# Patient Record
Sex: Male | Born: 1970 | Race: Black or African American | Hispanic: No | Marital: Single | Smoking: Former smoker
Health system: Southern US, Community
[De-identification: ages and names within clinical notes are randomized; demographics above are authoritative.]

## PROBLEM LIST (undated history)

## (undated) DIAGNOSIS — Z8719 Personal history of other diseases of the digestive system: Secondary | ICD-10-CM

## (undated) DIAGNOSIS — I2119 ST elevation (STEMI) myocardial infarction involving other coronary artery of inferior wall: Secondary | ICD-10-CM

## (undated) DIAGNOSIS — F1010 Alcohol abuse, uncomplicated: Secondary | ICD-10-CM

## (undated) DIAGNOSIS — Z720 Tobacco use: Secondary | ICD-10-CM

## (undated) DIAGNOSIS — I2510 Atherosclerotic heart disease of native coronary artery without angina pectoris: Secondary | ICD-10-CM

## (undated) DIAGNOSIS — I119 Hypertensive heart disease without heart failure: Secondary | ICD-10-CM

## (undated) DIAGNOSIS — I739 Peripheral vascular disease, unspecified: Secondary | ICD-10-CM

## (undated) DIAGNOSIS — Z9861 Coronary angioplasty status: Principal | ICD-10-CM

## (undated) HISTORY — DX: ST elevation (STEMI) myocardial infarction involving other coronary artery of inferior wall: I21.19

## (undated) HISTORY — DX: Atherosclerotic heart disease of native coronary artery without angina pectoris: I25.10

## (undated) HISTORY — DX: Peripheral vascular disease, unspecified: I73.9

## (undated) HISTORY — DX: Coronary angioplasty status: Z98.61

---

## 2008-03-28 ENCOUNTER — Emergency Department (HOSPITAL_COMMUNITY): Admission: EM | Admit: 2008-03-28 | Discharge: 2008-03-28 | Payer: Self-pay | Admitting: Emergency Medicine

## 2008-03-31 ENCOUNTER — Emergency Department (HOSPITAL_COMMUNITY): Admission: EM | Admit: 2008-03-31 | Discharge: 2008-03-31 | Payer: Self-pay | Admitting: Emergency Medicine

## 2008-06-12 ENCOUNTER — Emergency Department (HOSPITAL_COMMUNITY): Admission: EM | Admit: 2008-06-12 | Discharge: 2008-06-12 | Payer: Self-pay | Admitting: Emergency Medicine

## 2008-06-23 ENCOUNTER — Emergency Department (HOSPITAL_COMMUNITY): Admission: EM | Admit: 2008-06-23 | Discharge: 2008-06-23 | Payer: Self-pay | Admitting: Emergency Medicine

## 2008-12-27 ENCOUNTER — Emergency Department (HOSPITAL_COMMUNITY): Admission: EM | Admit: 2008-12-27 | Discharge: 2008-12-28 | Payer: Self-pay | Admitting: Emergency Medicine

## 2008-12-30 ENCOUNTER — Emergency Department (HOSPITAL_COMMUNITY): Admission: EM | Admit: 2008-12-30 | Discharge: 2008-12-30 | Payer: Self-pay | Admitting: Emergency Medicine

## 2009-02-28 ENCOUNTER — Emergency Department (HOSPITAL_COMMUNITY): Admission: EM | Admit: 2009-02-28 | Discharge: 2009-02-28 | Payer: Self-pay | Admitting: Emergency Medicine

## 2009-06-07 ENCOUNTER — Emergency Department (HOSPITAL_COMMUNITY): Admission: EM | Admit: 2009-06-07 | Discharge: 2009-06-07 | Payer: Self-pay | Admitting: Emergency Medicine

## 2009-10-27 ENCOUNTER — Emergency Department (HOSPITAL_COMMUNITY): Admission: EM | Admit: 2009-10-27 | Discharge: 2009-10-28 | Payer: Self-pay | Admitting: Emergency Medicine

## 2009-11-27 ENCOUNTER — Emergency Department (HOSPITAL_COMMUNITY): Admission: EM | Admit: 2009-11-27 | Discharge: 2009-11-27 | Payer: Self-pay | Admitting: Emergency Medicine

## 2010-09-02 ENCOUNTER — Emergency Department (HOSPITAL_COMMUNITY)
Admission: EM | Admit: 2010-09-02 | Discharge: 2010-09-02 | Payer: Self-pay | Source: Home / Self Care | Admitting: Emergency Medicine

## 2010-12-03 LAB — COMPREHENSIVE METABOLIC PANEL
ALT: 22 U/L (ref 0–53)
AST: 17 U/L (ref 0–37)
Albumin: 4.1 g/dL (ref 3.5–5.2)
Alkaline Phosphatase: 53 U/L (ref 39–117)
BUN: 11 mg/dL (ref 6–23)
CO2: 27 mEq/L (ref 19–32)
Calcium: 9.4 mg/dL (ref 8.4–10.5)
Chloride: 103 mEq/L (ref 96–112)
Creatinine, Ser: 1.19 mg/dL (ref 0.4–1.5)
GFR calc Af Amer: >60 mL/min (ref >60)
GFR calc non Af Amer: >60 mL/min (ref >60)
Glucose, Bld: 92 mg/dL (ref 70–99)
Potassium: 3.9 mEq/L (ref 3.5–5.1)
Sodium: 138 mEq/L (ref 135–145)
Total Bilirubin: 0.6 mg/dL (ref 0.3–1.2)
Total Protein: 5.6 g/dL — ABNORMAL LOW (ref 6.0–8.3)

## 2010-12-03 LAB — CBC
HCT: 42.5 % (ref 39.0–52.0)
Hemoglobin: 14.8 g/dL (ref 13.0–17.0)
MCH: 24.3 pg — ABNORMAL LOW (ref 26.0–34.0)
MCHC: 34.8 g/dL (ref 30.0–36.0)
MCV: 69.7 fL — ABNORMAL LOW (ref 78.0–100.0)
Platelets: 138 10*3/uL — ABNORMAL LOW (ref 150–400)
RBC: 6.1 MIL/uL — ABNORMAL HIGH (ref 4.22–5.81)
RDW: 15.8 % — ABNORMAL HIGH (ref 11.5–15.5)
WBC: 8.1 10*3/uL (ref 4.0–10.5)

## 2010-12-03 LAB — LIPASE, BLOOD: Lipase: 28 U/L (ref 11–59)

## 2010-12-11 LAB — URINE MICROSCOPIC-ADD ON

## 2010-12-11 LAB — URINALYSIS, ROUTINE W REFLEX MICROSCOPIC
Bilirubin Urine: NEGATIVE
Glucose, UA: NEGATIVE mg/dL
Leukocytes, UA: NEGATIVE
Nitrite: NEGATIVE
Specific Gravity, Urine: >1.03 — ABNORMAL HIGH (ref 1.005–1.030)
pH: 6 (ref 5.0–8.0)

## 2010-12-11 LAB — CBC
HCT: 44.9 % (ref 39.0–52.0)
Hemoglobin: 14.7 g/dL (ref 13.0–17.0)
RBC: 5.89 MIL/uL — ABNORMAL HIGH (ref 4.22–5.81)
RDW: 15.1 % (ref 11.5–15.5)
WBC: 8.4 10*3/uL (ref 4.0–10.5)

## 2010-12-11 LAB — DIFFERENTIAL
Basophils Absolute: 0 10*3/uL (ref 0.0–0.1)
Eosinophils Relative: 4 % (ref 0–5)
Lymphocytes Relative: 10 % — ABNORMAL LOW (ref 12–46)
Lymphs Abs: 0.8 10*3/uL (ref 0.7–4.0)
Monocytes Absolute: 1.1 10*3/uL — ABNORMAL HIGH (ref 0.1–1.0)
Neutro Abs: 6.2 10*3/uL (ref 1.7–7.7)

## 2010-12-11 LAB — BASIC METABOLIC PANEL
Calcium: 8.8 mg/dL (ref 8.4–10.5)
GFR calc Af Amer: >60 mL/min (ref >60)
GFR calc non Af Amer: >60 mL/min (ref >60)
Glucose, Bld: 104 mg/dL — ABNORMAL HIGH (ref 70–99)
Potassium: 4.2 mEq/L (ref 3.5–5.1)
Sodium: 136 mEq/L (ref 135–145)

## 2010-12-27 LAB — CULTURE, ROUTINE-ABSCESS

## 2011-03-08 ENCOUNTER — Emergency Department (HOSPITAL_COMMUNITY)
Admission: EM | Admit: 2011-03-08 | Discharge: 2011-03-09 | Disposition: A | Discharge status: Home / Self Care | Payer: No Typology Code available for payment source | Attending: Emergency Medicine | Admitting: Emergency Medicine

## 2011-03-08 ENCOUNTER — Emergency Department (HOSPITAL_COMMUNITY): Payer: No Typology Code available for payment source

## 2011-03-08 DIAGNOSIS — Y9289 Other specified places as the place of occurrence of the external cause: Secondary | ICD-10-CM | POA: Insufficient documentation

## 2011-03-08 DIAGNOSIS — I10 Essential (primary) hypertension: Secondary | ICD-10-CM | POA: Insufficient documentation

## 2011-03-08 DIAGNOSIS — S8000XA Contusion of unspecified knee, initial encounter: Secondary | ICD-10-CM | POA: Insufficient documentation

## 2011-03-08 DIAGNOSIS — J45909 Unspecified asthma, uncomplicated: Secondary | ICD-10-CM | POA: Insufficient documentation

## 2011-06-24 LAB — MONONUCLEOSIS SCREEN: Mono Screen: NEGATIVE

## 2013-04-22 ENCOUNTER — Encounter (HOSPITAL_COMMUNITY): Payer: Self-pay

## 2013-04-22 ENCOUNTER — Emergency Department (HOSPITAL_COMMUNITY)
Admission: EM | Admit: 2013-04-22 | Discharge: 2013-04-23 | Disposition: A | Discharge status: Home / Self Care | Payer: Self-pay | Attending: Emergency Medicine | Admitting: Emergency Medicine

## 2013-04-22 DIAGNOSIS — R1110 Vomiting, unspecified: Secondary | ICD-10-CM | POA: Insufficient documentation

## 2013-04-22 DIAGNOSIS — R3589 Other polyuria: Secondary | ICD-10-CM | POA: Insufficient documentation

## 2013-04-22 DIAGNOSIS — W57XXXA Bitten or stung by nonvenomous insect and other nonvenomous arthropods, initial encounter: Secondary | ICD-10-CM | POA: Insufficient documentation

## 2013-04-22 DIAGNOSIS — R51 Headache: Secondary | ICD-10-CM | POA: Insufficient documentation

## 2013-04-22 DIAGNOSIS — R631 Polydipsia: Secondary | ICD-10-CM | POA: Insufficient documentation

## 2013-04-22 DIAGNOSIS — I499 Cardiac arrhythmia, unspecified: Secondary | ICD-10-CM | POA: Insufficient documentation

## 2013-04-22 DIAGNOSIS — IMO0002 Reserved for concepts with insufficient information to code with codable children: Secondary | ICD-10-CM | POA: Insufficient documentation

## 2013-04-22 DIAGNOSIS — Y929 Unspecified place or not applicable: Secondary | ICD-10-CM | POA: Insufficient documentation

## 2013-04-22 DIAGNOSIS — I10 Essential (primary) hypertension: Secondary | ICD-10-CM | POA: Insufficient documentation

## 2013-04-22 DIAGNOSIS — F17200 Nicotine dependence, unspecified, uncomplicated: Secondary | ICD-10-CM | POA: Insufficient documentation

## 2013-04-22 DIAGNOSIS — Z79899 Other long term (current) drug therapy: Secondary | ICD-10-CM | POA: Insufficient documentation

## 2013-04-22 DIAGNOSIS — Y9389 Activity, other specified: Secondary | ICD-10-CM | POA: Insufficient documentation

## 2013-04-22 DIAGNOSIS — T148 Other injury of unspecified body region: Secondary | ICD-10-CM | POA: Insufficient documentation

## 2013-04-22 DIAGNOSIS — R358 Other polyuria: Secondary | ICD-10-CM | POA: Insufficient documentation

## 2013-04-22 NOTE — ED Notes (Signed)
Started vomiting this morning per pt.

## 2013-04-22 NOTE — ED Notes (Signed)
Insect bites to abdomen, right ankle, and privates. They are bothering me per pt.

## 2013-04-22 NOTE — ED Provider Notes (Signed)
CSN: [id number]     Arrival date & time 04/22/13  2210 History  This chart was scribed for [employer]. Rubin Payor, MD by Bennett Scrape, ED Scribe. This patient was seen in room APA19/APA19 and the patient's care was started at 11:06 PM.   First MD Initiated Contact with Patient 04/22/13 2304     Chief Complaint  Patient presents with  . Irregular Heart Beat  . Emesis  . Insect Bite    The history is provided by the patient. No language interpreter was used.    HPI Comments: Brian Harvey is a 42 y.o. male who presents to the Emergency Department complaining of several insect bites described as itchy to the right ankle, buttocks, genitals and bilateral arms yesterday after fishing. He denies seeing any insects. He denies having prior episodes of similar symptoms. Pt denies having any sick contacts with similar symptoms. He reports one episode of emesis that occurred this morning but denies fevers, abdominal pain and rashes as associated symptoms. He reports on-going neck pain described as pressure that he takes [facility] powders for. He denies playing sports professionally or as a child. He also reports nocturia that he attributes to drinking water prior to going to bed. He denies having a h/o DM or prior CBG checks. Pt also reports daily HAs and ongoing HTN. Highest BP has been 168/100 Pt does not have a h/o chronic medical conditions. Pt is a current everyday smoker and 2 beers per day alcohol user. He admits to having 2 beers today.   History reviewed. No pertinent past medical history.  History reviewed. No pertinent past surgical history.  No family history on file.  History  Substance Use Topics  . Smoking status: Current Every Day Smoker  . Smokeless tobacco: Not on file  . Alcohol Use: Yes    Review of Systems  Constitutional: Negative for fever and unexpected weight change.  Respiratory: Negative for shortness of breath.   Cardiovascular: Negative for chest pain and palpitations.   Gastrointestinal: Negative for nausea.  Endocrine: Positive for polydipsia and polyuria.  Skin: Positive for wound. Negative for rash.  Neurological: Positive for headaches (daily).  All other systems reviewed and are negative.    Allergies  Review of patient's allergies indicates no known allergies.  Home Medications   Current Outpatient Rx  Name  Route  Sig  Dispense  Refill  . Aspirin-Salicylamide-Caffeine (BC HEADACHE) 325-95-16 MG TABS   Oral   Take 1 packet by mouth 2 (two) times daily as needed (for pain).         Marland Kitchen amLODipine (NORVASC) 10 MG tablet   Oral   Take 1 tablet (10 mg total) by mouth daily.   14 tablet   0   . predniSONE (DELTASONE) 20 MG tablet   Oral   Take 2 tablets (40 mg total) by mouth daily.   4 tablet   0    Triage Vitals: BP 179/91  Pulse 84  Temp(Src) 97.5 F (36.4 C) (Oral)  Resp 20  Ht 5\' 6"  (1.676 m)  Wt 170 lb (77.111 kg)  BMI 27.45 kg/m2  SpO2 100%  Physical Exam  Nursing note and vitals reviewed. Constitutional: He is oriented to person, place, and time. He appears well-developed and well-nourished. No distress.  HENT:  Head: Normocephalic and atraumatic.  Eyes: Conjunctivae and EOM are normal.  Neck: Normal range of motion. Neck supple. No tracheal deviation present.  Cardiovascular: Normal rate, regular rhythm and normal heart sounds.  No murmur heard. Pulmonary/Chest: Effort normal. No respiratory distress. He has wheezes (slight). He has no rales.  Abdominal: Soft. Bowel sounds are normal. There is no tenderness.  Musculoskeletal: Normal range of motion. He exhibits no edema.  Neurological: He is alert and oriented to person, place, and time. No cranial nerve deficit.  Skin: Skin is warm and dry.  1 cm papular erythmatous rash to left posterior thigh, bilateral ankles and left arm, no rash on palms  Psychiatric: He has a normal mood and affect. His behavior is normal.    ED Course   Procedures (including critical  care time)  DIAGNOSTIC STUDIES: Oxygen Saturation is 100% on room air, normal by my interpretation.    COORDINATION OF CARE: 11:12 PM-Discussed treatment plan which includes CBG, steroids and HTN medications with pt at bedside and pt agreed to plan. Advised pt that BP is high and will need to follow up with PCP   Labs Reviewed  BASIC METABOLIC PANEL - Abnormal; Notable for the following:    GFR calc non Af Amer 75 (*)    GFR calc Af Amer 87 (*)    All other components within normal limits   No results found. 1. Insect bite   2. Hypertension    Date: 04/23/2013  Rate: 100  Rhythm: normal sinus rhythm  QRS Axis: normal  Intervals: normal  ST/T Wave abnormalities: nonspecific ST/T changes  Conduction Disutrbances:LVH  Narrative Interpretation:   Old EKG Reviewed: none available    MDM  Patient with rash, maybe 2 to insect bites. Also has had headaches and hypertension. He states his blood pressures always been high and they have tried numerous medicines. He also set some urinary frequency. Lab works reassuring. EKG showed some LVH. Patient be started on blood pressure medications and will followup with her PCP. He was given steroids for the itching. Doubt cellulitis at this time  I personally performed the services described in this documentation, which was scribed in my presence. The recorded information has been reviewed and is accurate.     Juliet Rude. Rubin Payor, MD 04/23/13 [phone]

## 2013-04-23 LAB — BASIC METABOLIC PANEL
CO2: 22 mEq/L (ref 19–32)
Chloride: 97 mEq/L (ref 96–112)
GFR calc Af Amer: 87 mL/min — ABNORMAL LOW (ref >90)
Potassium: 3.6 mEq/L (ref 3.5–5.1)

## 2013-04-23 MED ORDER — AMLODIPINE BESYLATE 10 MG PO TABS: 10.0000 mg | ORAL_TABLET | Freq: Every day | ORAL | Status: DC

## 2013-04-23 MED ORDER — PREDNISONE 50 MG PO TABS
60.0000 mg | ORAL_TABLET | Freq: Once | ORAL | Status: AC
  Administered 2013-04-23: 60 mg via ORAL
  Filled 2013-04-23: qty 1

## 2013-04-23 MED ORDER — PREDNISONE 20 MG PO TABS: 40.0000 mg | ORAL_TABLET | Freq: Every day | ORAL | Status: DC

## 2014-11-25 ENCOUNTER — Emergency Department (HOSPITAL_COMMUNITY)
Admission: EM | Admit: 2014-11-25 | Discharge: 2014-11-25 | Disposition: A | Discharge status: Home / Self Care | Payer: Self-pay | Attending: Emergency Medicine | Admitting: Emergency Medicine

## 2014-11-25 ENCOUNTER — Emergency Department (HOSPITAL_COMMUNITY): Payer: Self-pay

## 2014-11-25 ENCOUNTER — Encounter (HOSPITAL_COMMUNITY): Payer: Self-pay | Admitting: Emergency Medicine

## 2014-11-25 DIAGNOSIS — K852 Alcohol induced acute pancreatitis without necrosis or infection: Secondary | ICD-10-CM

## 2014-11-25 DIAGNOSIS — R52 Pain, unspecified: Secondary | ICD-10-CM

## 2014-11-25 DIAGNOSIS — Z720 Tobacco use: Secondary | ICD-10-CM | POA: Insufficient documentation

## 2014-11-25 DIAGNOSIS — I10 Essential (primary) hypertension: Secondary | ICD-10-CM | POA: Insufficient documentation

## 2014-11-25 LAB — CBC WITH DIFFERENTIAL/PLATELET
BASOS ABS: 0 10*3/uL (ref 0.0–0.1)
BASOS PCT: 0 % (ref 0–1)
EOS ABS: 0.4 10*3/uL (ref 0.0–0.7)
EOS PCT: 4 % (ref 0–5)
HCT: 47.2 % (ref 39.0–52.0)
HEMOGLOBIN: 16 g/dL (ref 13.0–17.0)
LYMPHS ABS: 2 10*3/uL (ref 0.7–4.0)
LYMPHS PCT: 19 % (ref 12–46)
MCH: 25.8 pg — AB (ref 26.0–34.0)
MCHC: 33.9 g/dL (ref 30.0–36.0)
MCV: 76 fL — ABNORMAL LOW (ref 78.0–100.0)
MONOS PCT: 8 % (ref 3–12)
Monocytes Absolute: 0.9 10*3/uL (ref 0.1–1.0)
NEUTROS ABS: 7.2 10*3/uL (ref 1.7–7.7)
Neutrophils Relative %: 69 % (ref 43–77)
PLATELETS: 138 10*3/uL — AB (ref 150–400)
RBC: 6.21 MIL/uL — AB (ref 4.22–5.81)
RDW: 14.4 % (ref 11.5–15.5)
WBC: 10.6 10*3/uL — AB (ref 4.0–10.5)

## 2014-11-25 LAB — I-STAT CHEM 8, ED
BUN: 17 mg/dL (ref 6–23)
CALCIUM ION: 1.21 mmol/L (ref 1.12–1.23)
CHLORIDE: 97 mmol/L (ref 96–112)
CREATININE: 1.3 mg/dL (ref 0.50–1.35)
GLUCOSE: 102 mg/dL — AB (ref 70–99)
HEMATOCRIT: 56 % — AB (ref 39.0–52.0)
HEMOGLOBIN: 19 g/dL — AB (ref 13.0–17.0)
POTASSIUM: 4.7 mmol/L (ref 3.5–5.1)
Sodium: 137 mmol/L (ref 135–145)
TCO2: 25 mmol/L (ref 0–100)

## 2014-11-25 LAB — COMPREHENSIVE METABOLIC PANEL
ALBUMIN: 4.4 g/dL (ref 3.5–5.2)
ALT: 26 U/L (ref 0–53)
ANION GAP: 6 (ref 5–15)
AST: 18 U/L (ref 0–37)
Alkaline Phosphatase: 78 U/L (ref 39–117)
BUN: 16 mg/dL (ref 6–23)
CHLORIDE: 99 mmol/L (ref 96–112)
CO2: 29 mmol/L (ref 19–32)
Calcium: 9.4 mg/dL (ref 8.4–10.5)
Creatinine, Ser: 1.3 mg/dL (ref 0.50–1.35)
GFR calc Af Amer: 76 mL/min — ABNORMAL LOW (ref >90)
GFR, EST NON AFRICAN AMERICAN: 66 mL/min — AB (ref >90)
GLUCOSE: 103 mg/dL — AB (ref 70–99)
POTASSIUM: 4.7 mmol/L (ref 3.5–5.1)
SODIUM: 134 mmol/L — AB (ref 135–145)
TOTAL PROTEIN: 8 g/dL (ref 6.0–8.3)
Total Bilirubin: 0.1 mg/dL — ABNORMAL LOW (ref 0.3–1.2)

## 2014-11-25 LAB — I-STAT TROPONIN, ED: Troponin i, poc: 0.01 ng/mL (ref 0.00–0.08)

## 2014-11-25 LAB — LIPASE, BLOOD: LIPASE: 87 U/L — AB (ref 11–59)

## 2014-11-25 MED ORDER — PANTOPRAZOLE SODIUM 40 MG IV SOLR
40.0000 mg | Freq: Once | INTRAVENOUS | Status: AC
  Administered 2014-11-25: 40 mg via INTRAVENOUS
  Filled 2014-11-25: qty 40

## 2014-11-25 MED ORDER — LISINOPRIL 20 MG PO TABS: 20.0000 mg | ORAL_TABLET | Freq: Every day | ORAL | Status: DC

## 2014-11-25 MED ORDER — HYDROMORPHONE HCL 1 MG/ML IJ SOLN
0.5000 mg | Freq: Once | INTRAMUSCULAR | Status: AC
  Administered 2014-11-25: 0.5 mg via INTRAVENOUS
  Filled 2014-11-25: qty 1

## 2014-11-25 MED ORDER — HYDROCODONE-ACETAMINOPHEN 5-325 MG PO TABS: 1.0000 | ORAL_TABLET | Freq: Four times a day (QID) | ORAL | Status: DC | PRN

## 2014-11-25 MED ORDER — RANITIDINE HCL 150 MG PO CAPS: 150.0000 mg | ORAL_CAPSULE | Freq: Every day | ORAL | Status: DC

## 2014-11-25 MED ORDER — ONDANSETRON HCL 4 MG/2ML IJ SOLN
4.0000 mg | Freq: Once | INTRAMUSCULAR | Status: AC
  Administered 2014-11-25: 4 mg via INTRAVENOUS
  Filled 2014-11-25: qty 2

## 2014-11-25 MED ORDER — LABETALOL HCL 5 MG/ML IV SOLN
20.0000 mg | Freq: Once | INTRAVENOUS | Status: AC
  Administered 2014-11-25: 20 mg via INTRAVENOUS
  Filled 2014-11-25: qty 4

## 2014-11-25 MED ORDER — LORAZEPAM 2 MG/ML IJ SOLN
1.0000 mg | Freq: Once | INTRAMUSCULAR | Status: AC
  Administered 2014-11-25: 1 mg via INTRAVENOUS
  Filled 2014-11-25: qty 1

## 2014-11-25 NOTE — ED Notes (Signed)
12 lead ekg performed given to Dr. Aileen PilotZammitt

## 2014-11-25 NOTE — ED Provider Notes (Signed)
CSN: [id number]     Arrival date & time 11/25/14  1738 History  This chart was scribed for Brian LennertJoseph L Dylynn Ketner, MD by Tanda RockersMargaux Venter, ED Scribe. This patient was seen in room APA12/APA12 and the patient's care was started at 5:55 PM.    Chief Complaint  Patient presents with  . Abdominal Pain   Patient is a 44 y.o. male presenting with abdominal pain. The history is provided by the patient. No language interpreter was used.  Abdominal Pain Pain location:  Epigastric Pain radiates to:  Does not radiate Onset quality:  Sudden Duration:  1 week Timing:  Constant Chronicity:  New Associated symptoms: diarrhea (Resolved. )   Associated symptoms: no chest pain, no cough, no fatigue, no hematuria, no nausea and no vomiting     HPI Comments: Brian Harvey is a 44 y.o. male who presents to the Emergency Department complaining of sharp pain in epigastric region that began 1 week ago. Pt reports that he also had diarrhea but it has since resolved. He denies nausea, vomiting, chest pain, or any other symptoms.   PCP - None  History reviewed. No pertinent past medical history. History reviewed. No pertinent past surgical history. Family History  Problem Relation Age of Onset  . Diabetes Father    History  Substance Use Topics  . Smoking status: Current Every Day Smoker -- 1.50 packs/day for 25 years    Types: Cigarettes  . Smokeless tobacco: Never Used  . Alcohol Use: 8.4 oz/week    14 Cans of beer per week    Review of Systems  Constitutional: Negative for appetite change and fatigue.  HENT: Negative for congestion, ear discharge and sinus pressure.   Eyes: Negative for discharge.  Respiratory: Negative for cough.   Cardiovascular: Negative for chest pain.  Gastrointestinal: Positive for abdominal pain and diarrhea (Resolved. ). Negative for nausea and vomiting.  Genitourinary: Negative for frequency and hematuria.  Musculoskeletal: Negative for back pain.  Skin: Negative for rash.   Neurological: Negative for seizures and headaches.  Psychiatric/Behavioral: Negative for hallucinations.      Allergies  Review of patient's allergies indicates no known allergies.  Home Medications   Prior to Admission medications   Medication Sig Start Date End Date Taking? Authorizing Provider  Aspirin-Salicylamide-Caffeine (BC HEADACHE) 325-95-16 MG TABS Take 1 packet by mouth 2 (two) times daily as needed (for pain).   Yes Historical Provider, MD   Triage Vitals: BP 183/116 mmHg  Pulse 91  Temp(Src) 98 F (36.7 C) (Oral)  Resp 16  Ht 5\' 5"  (1.651 m)  Wt 195 lb (88.451 kg)  BMI 32.45 kg/m2  SpO2 100%   Physical Exam  Constitutional: He is oriented to person, place, and time. He appears well-developed.  HENT:  Head: Normocephalic.  Eyes: Conjunctivae and EOM are normal. No scleral icterus.  Neck: Neck supple. No thyromegaly present.  Cardiovascular: Normal rate and regular rhythm.  Exam reveals no gallop and no friction rub.   No murmur heard. Pulmonary/Chest: No stridor. He has no wheezes. He has no rales. He exhibits no tenderness.  Abdominal: He exhibits no distension. There is tenderness (Moderate epigastric tenderness.). There is no rebound.  Musculoskeletal: Normal range of motion. He exhibits no edema.  Lymphadenopathy:    He has no cervical adenopathy.  Neurological: He is oriented to person, place, and time. He exhibits normal muscle tone. Coordination normal.  Skin: No rash noted. No erythema.  Psychiatric: He has a normal mood and affect. His  behavior is normal.    ED Course  Procedures (including critical care time)  DIAGNOSTIC STUDIES: Oxygen Saturation is 100% on RA, normal by my interpretation.    COORDINATION OF CARE: 6:00 PM-Discussed treatment plan which includes EKG with pt at bedside and pt agreed to plan.   Labs Review Labs Reviewed  CBC WITH DIFFERENTIAL/PLATELET - Abnormal; Notable for the following:    WBC 10.6 (*)    RBC 6.21 (*)     MCV 76.0 (*)    MCH 25.8 (*)    Platelets 138 (*)    All other components within normal limits  COMPREHENSIVE METABOLIC PANEL - Abnormal; Notable for the following:    Sodium 134 (*)    Glucose, Bld 103 (*)    Total Bilirubin 0.1 (*)    GFR calc non Af Amer 66 (*)    GFR calc Af Amer 76 (*)    All other components within normal limits  LIPASE, BLOOD - Abnormal; Notable for the following:    Lipase 87 (*)    All other components within normal limits  I-STAT CHEM 8, ED - Abnormal; Notable for the following:    Glucose, Bld 102 (*)    Hemoglobin 19.0 (*)    HCT 56.0 (*)    All other components within normal limits  I-STAT TROPOININ, ED    Imaging Review No results found.   EKG Interpretation   Date/Time:  Saturday November 25 2014 17:48:20 EST Ventricular Rate:  90 PR Interval:  146 QRS Duration: 85 QT Interval:  338 QTC Calculation: 413 R Axis:   47 Text Interpretation:  Sinus rhythm Abnormal T, consider ischemia, diffuse  leads ST elevation, consider anterior injury Confirmed by Stylianos Stradling  MD,  Tzivia Oneil [id number]) on 11/25/2014 5:54:55 PM      MDM   Final diagnoses:  Pain    Pancreatitis alcohol induced.   Htn,  Cad,  Pt started on zantac, hydrocodone and lisinopril and is to follow up with pcp and cardiology  The chart was scribed for me under my direct supervision.  I personally performed the history, physical, and medical decision making and all procedures in the evaluation of this patient.Brian Lennert, MD 11/25/14 [phone]

## 2014-11-25 NOTE — Discharge Instructions (Signed)
Follow up with a family md in one week to recheck your stomach.   Follow up with [facility] in one week for blood pressure and to check your heart

## 2014-11-25 NOTE — ED Notes (Addendum)
Patient c/o mid upper abd pain x1 weeks. Denies any nausea, vomiting, diarrhea, or fevers. Per patient taking multiple antiacids with no relief. Per patient BM today normal with no blood noted.

## 2014-11-29 ENCOUNTER — Encounter (HOSPITAL_COMMUNITY): Payer: Self-pay

## 2014-11-29 ENCOUNTER — Emergency Department (HOSPITAL_COMMUNITY)
Admission: EM | Admit: 2014-11-29 | Discharge: 2014-11-29 | Disposition: A | Discharge status: Home / Self Care | Payer: Self-pay | Attending: Emergency Medicine | Admitting: Emergency Medicine

## 2014-11-29 DIAGNOSIS — Z720 Tobacco use: Secondary | ICD-10-CM | POA: Insufficient documentation

## 2014-11-29 DIAGNOSIS — T464X5A Adverse effect of angiotensin-converting-enzyme inhibitors, initial encounter: Secondary | ICD-10-CM | POA: Insufficient documentation

## 2014-11-29 DIAGNOSIS — I10 Essential (primary) hypertension: Secondary | ICD-10-CM | POA: Insufficient documentation

## 2014-11-29 DIAGNOSIS — R05 Cough: Secondary | ICD-10-CM | POA: Insufficient documentation

## 2014-11-29 DIAGNOSIS — Z79899 Other long term (current) drug therapy: Secondary | ICD-10-CM | POA: Insufficient documentation

## 2014-11-29 MED ORDER — RANITIDINE HCL 150 MG PO CAPS: 150.0000 mg | ORAL_CAPSULE | Freq: Every day | ORAL | Status: DC

## 2014-11-29 MED ORDER — HYDROCODONE-ACETAMINOPHEN 5-325 MG PO TABS: 1.0000 | ORAL_TABLET | Freq: Four times a day (QID) | ORAL | Status: DC | PRN

## 2014-11-29 MED ORDER — HYDROCHLOROTHIAZIDE 25 MG PO TABS: 25.0000 mg | ORAL_TABLET | Freq: Every day | ORAL | Status: DC

## 2014-11-29 NOTE — ED Notes (Addendum)
Pt reports started taking lisinopril and since then has been coughing.   Denies any tongue or airway swelling.

## 2014-11-29 NOTE — ED Notes (Signed)
Verified Zantac and HCTZ are on the [facility].

## 2014-11-29 NOTE — ED Provider Notes (Signed)
CSN: [id number]     Arrival date & time 11/29/14  1544 History   First MD Initiated Contact with Patient 11/29/14 1714     Chief Complaint  Patient presents with  . Medication Reaction     (Consider location/radiation/quality/duration/timing/severity/associated sxs/prior Treatment) The history is provided by the patient.   Brian Harvey is a 44 y.o. male who presents to the ED with what he thinks is a reaction to his BP medication. He started taking lisinopril and since then has had a cough. He was started on the medication here in the ED two days ago. He takes the medication in the morning. He denies any other problems. Patient reports that on his previous visit for his flair up of pancreatitis he was given Rx for Zantac and pain medication plus the BP medication. He felt that the BP medication was the most important and only had money to get the BP medication. They told him at the drug store that the other medications would cost over $30 each. He wants to know if there is something less expensive.    Past Medical History  Diagnosis Date  . Hypertension    History reviewed. No pertinent past surgical history. Family History  Problem Relation Age of Onset  . Diabetes Father    History  Substance Use Topics  . Smoking status: Current Every Day Smoker -- 1.50 packs/day for 25 years    Types: Cigarettes  . Smokeless tobacco: Never Used  . Alcohol Use: 8.4 oz/week    14 Cans of beer per week     Comment: occ    Review of Systems  Respiratory: Positive for cough.   all other systems negative    Allergies  Review of patient's allergies indicates no known allergies.  Home Medications   Prior to Admission medications   Medication Sig Start Date End Date Taking? Authorizing Provider  Aspirin-Salicylamide-Caffeine (BC HEADACHE) 325-95-16 MG TABS Take 1 packet by mouth 2 (two) times daily as needed (for pain).    Historical Provider, MD  hydrochlorothiazide (HYDRODIURIL) 25 MG  tablet Take 1 tablet (25 mg total) by mouth daily. 11/29/14   Hope Orlene Och, NP  HYDROcodone-acetaminophen (NORCO) 5-325 MG per tablet Take 1 tablet by mouth every 6 (six) hours as needed for moderate pain. 11/29/14   Hope Orlene Och, NP  ranitidine (ZANTAC) 150 MG capsule Take 1 capsule (150 mg total) by mouth daily. 11/29/14   Hope Orlene Och, NP   BP 167/102 mmHg  Pulse 101  Temp(Src) 98.2 F (36.8 C) (Oral)  Resp 18  Ht  (1.651 m)  Wt 190 lb (86.183 kg)  BMI 31.62 kg/m2  SpO2 100% Physical Exam  Constitutional: He is oriented to person, place, and time. He appears well-developed and well-nourished.  HENT:  Head: Normocephalic and atraumatic.  Mouth/Throat: Uvula is midline, oropharynx is clear and moist and mucous membranes are normal.  Eyes: Conjunctivae and EOM are normal.  Neck: Normal range of motion. Neck supple.  Cardiovascular: Normal rate and regular rhythm.   Pulmonary/Chest: Effort normal and breath sounds normal. No respiratory distress. He has no wheezes. He has no rales.  Abdominal: Soft. There is no tenderness.  Musculoskeletal: Normal range of motion.  Neurological: He is alert and oriented to person, place, and time. No cranial nerve deficit.  Skin: Skin is warm and dry.  Psychiatric: He has a normal mood and affect. His behavior is normal.  Nursing note and vitals reviewed.   ED Course  Procedures (including critical care time) Labs Review Discussed with Dr. Fayrene FearingJames. Will start patient on HCTZ 25 mg q am. Will re write Rx for pain medication and Zantac. [facility]. HCTZ and Zantac are on the $4 list.   MDM  44 y.o. male with cough after starting new BP medication. Stable for d/c with O2 SAT 100 % on R/A. He will stop the BP medication and start the HCTZ. He will also fill his other medications that were prescribed on previous visit.    Final diagnoses:  Cough due to ACE inhibitor       [facility] Neese, NP 11/30/14 1716  Rolland PorterMark James,  MD 12/08/14 1544

## 2014-11-29 NOTE — Discharge Instructions (Signed)
Stop the medication you are taking for your blood pressure because it is making you cough. Start the new medication and call [facility] for follow up. I have written for your Zantac and your pain medication.   [facility] and the Blood pressure medication are on the $4 list there.

## 2016-05-23 DIAGNOSIS — I2510 Atherosclerotic heart disease of native coronary artery without angina pectoris: Secondary | ICD-10-CM

## 2016-05-23 DIAGNOSIS — Z9861 Coronary angioplasty status: Secondary | ICD-10-CM

## 2016-05-23 DIAGNOSIS — I2119 ST elevation (STEMI) myocardial infarction involving other coronary artery of inferior wall: Secondary | ICD-10-CM

## 2016-05-23 HISTORY — DX: ST elevation (STEMI) myocardial infarction involving other coronary artery of inferior wall: I21.19

## 2016-05-23 HISTORY — DX: Coronary angioplasty status: Z98.61

## 2016-05-23 HISTORY — DX: Atherosclerotic heart disease of native coronary artery without angina pectoris: I25.10

## 2016-05-28 ENCOUNTER — Inpatient Hospital Stay (HOSPITAL_COMMUNITY)
Admission: EM | Admit: 2016-05-28 | Discharge: 2016-05-30 | DRG: 246 | Source: Ambulatory Visit | Attending: Cardiology | Admitting: Cardiology

## 2016-05-28 ENCOUNTER — Encounter (HOSPITAL_COMMUNITY): Payer: Self-pay | Admitting: Emergency Medicine

## 2016-05-28 ENCOUNTER — Encounter (HOSPITAL_COMMUNITY): Admission: EM | Payer: Self-pay | Source: Ambulatory Visit | Attending: Cardiology

## 2016-05-28 DIAGNOSIS — Z87891 Personal history of nicotine dependence: Secondary | ICD-10-CM | POA: Diagnosis not present

## 2016-05-28 DIAGNOSIS — I25119 Atherosclerotic heart disease of native coronary artery with unspecified angina pectoris: Secondary | ICD-10-CM | POA: Diagnosis present

## 2016-05-28 DIAGNOSIS — I2510 Atherosclerotic heart disease of native coronary artery without angina pectoris: Secondary | ICD-10-CM

## 2016-05-28 DIAGNOSIS — Z720 Tobacco use: Secondary | ICD-10-CM | POA: Diagnosis not present

## 2016-05-28 DIAGNOSIS — Z888 Allergy status to other drugs, medicaments and biological substances status: Secondary | ICD-10-CM

## 2016-05-28 DIAGNOSIS — I119 Hypertensive heart disease without heart failure: Secondary | ICD-10-CM | POA: Diagnosis present

## 2016-05-28 DIAGNOSIS — Z8719 Personal history of other diseases of the digestive system: Secondary | ICD-10-CM

## 2016-05-28 DIAGNOSIS — Z833 Family history of diabetes mellitus: Secondary | ICD-10-CM

## 2016-05-28 DIAGNOSIS — I10 Essential (primary) hypertension: Secondary | ICD-10-CM | POA: Diagnosis not present

## 2016-05-28 DIAGNOSIS — R079 Chest pain, unspecified: Secondary | ICD-10-CM | POA: Diagnosis not present

## 2016-05-28 DIAGNOSIS — I213 ST elevation (STEMI) myocardial infarction of unspecified site: Secondary | ICD-10-CM

## 2016-05-28 DIAGNOSIS — I340 Nonrheumatic mitral (valve) insufficiency: Secondary | ICD-10-CM | POA: Diagnosis present

## 2016-05-28 DIAGNOSIS — E785 Hyperlipidemia, unspecified: Secondary | ICD-10-CM | POA: Diagnosis present

## 2016-05-28 DIAGNOSIS — Z79899 Other long term (current) drug therapy: Secondary | ICD-10-CM | POA: Diagnosis not present

## 2016-05-28 DIAGNOSIS — I5189 Other ill-defined heart diseases: Secondary | ICD-10-CM

## 2016-05-28 DIAGNOSIS — E876 Hypokalemia: Secondary | ICD-10-CM

## 2016-05-28 DIAGNOSIS — I252 Old myocardial infarction: Secondary | ICD-10-CM | POA: Diagnosis present

## 2016-05-28 DIAGNOSIS — I25110 Atherosclerotic heart disease of native coronary artery with unstable angina pectoris: Secondary | ICD-10-CM | POA: Diagnosis not present

## 2016-05-28 DIAGNOSIS — I519 Heart disease, unspecified: Secondary | ICD-10-CM | POA: Diagnosis not present

## 2016-05-28 DIAGNOSIS — I2121 ST elevation (STEMI) myocardial infarction involving left circumflex coronary artery: Secondary | ICD-10-CM | POA: Diagnosis present

## 2016-05-28 DIAGNOSIS — Z9861 Coronary angioplasty status: Secondary | ICD-10-CM

## 2016-05-28 DIAGNOSIS — F1010 Alcohol abuse, uncomplicated: Secondary | ICD-10-CM

## 2016-05-28 DIAGNOSIS — R570 Cardiogenic shock: Secondary | ICD-10-CM | POA: Diagnosis present

## 2016-05-28 DIAGNOSIS — I2119 ST elevation (STEMI) myocardial infarction involving other coronary artery of inferior wall: Secondary | ICD-10-CM | POA: Diagnosis present

## 2016-05-28 HISTORY — DX: Tobacco use: Z72.0

## 2016-05-28 HISTORY — DX: Personal history of other diseases of the digestive system: Z87.19

## 2016-05-28 HISTORY — DX: Alcohol abuse, uncomplicated: F10.10

## 2016-05-28 HISTORY — PX: CARDIAC CATHETERIZATION: SHX172

## 2016-05-28 HISTORY — DX: Hypertensive heart disease without heart failure: I11.9

## 2016-05-28 LAB — DIFFERENTIAL
Basophils Absolute: 0.1 10*3/uL (ref 0.0–0.1)
Basophils Relative: 1 %
EOS PCT: 4 %
Eosinophils Absolute: 0.4 10*3/uL (ref 0.0–0.7)
LYMPHS ABS: 2.8 10*3/uL (ref 0.7–4.0)
Lymphocytes Relative: 25 %
MONOS PCT: 7 %
Monocytes Absolute: 0.8 10*3/uL (ref 0.1–1.0)
NEUTROS ABS: 7.1 10*3/uL (ref 1.7–7.7)
Neutrophils Relative %: 63 %

## 2016-05-28 LAB — COMPREHENSIVE METABOLIC PANEL
ALBUMIN: 3.8 g/dL (ref 3.5–5.0)
ALK PHOS: 53 U/L (ref 38–126)
ALT: 20 U/L (ref 17–63)
ALT: 40 U/L (ref 17–63)
ANION GAP: 9 (ref 5–15)
AST: 19 U/L (ref 15–41)
AST: 231 U/L — ABNORMAL HIGH (ref 15–41)
Albumin: 3.7 g/dL (ref 3.5–5.0)
Alkaline Phosphatase: 56 U/L (ref 38–126)
Anion gap: 10 (ref 5–15)
BILIRUBIN TOTAL: 0.6 mg/dL (ref 0.3–1.2)
BUN: 14 mg/dL (ref 6–20)
BUN: 13 mg/dL (ref 6–20)
CALCIUM: 8.8 mg/dL — AB (ref 8.9–10.3)
CO2: 26 mmol/L (ref 22–32)
CO2: 24 mmol/L (ref 22–32)
Calcium: 8.8 mg/dL — ABNORMAL LOW (ref 8.9–10.3)
Chloride: 101 mmol/L (ref 101–111)
Chloride: 105 mmol/L (ref 101–111)
Creatinine, Ser: 1.25 mg/dL — ABNORMAL HIGH (ref 0.61–1.24)
Creatinine, Ser: 1.2 mg/dL (ref 0.61–1.24)
GFR calc Af Amer: >60 mL/min (ref >60)
GFR calc non Af Amer: >60 mL/min (ref >60)
GLUCOSE: 147 mg/dL — AB (ref 65–99)
Glucose, Bld: 101 mg/dL — ABNORMAL HIGH (ref 65–99)
POTASSIUM: 3.2 mmol/L — AB (ref 3.5–5.1)
Potassium: 3.9 mmol/L (ref 3.5–5.1)
SODIUM: 137 mmol/L (ref 135–145)
SODIUM: 138 mmol/L (ref 135–145)
TOTAL PROTEIN: 6.4 g/dL — AB (ref 6.5–8.1)
Total Bilirubin: 0.5 mg/dL (ref 0.3–1.2)
Total Protein: 6.3 g/dL — ABNORMAL LOW (ref 6.5–8.1)

## 2016-05-28 LAB — CBC
HEMATOCRIT: 38.3 % — AB (ref 39.0–52.0)
Hemoglobin: 12.2 g/dL — ABNORMAL LOW (ref 13.0–17.0)
MCH: 23.4 pg — ABNORMAL LOW (ref 26.0–34.0)
MCHC: 31.9 g/dL (ref 30.0–36.0)
MCV: 73.4 fL — AB (ref 78.0–100.0)
Platelets: 165 10*3/uL (ref 150–400)
RBC: 5.22 MIL/uL (ref 4.22–5.81)
RDW: 15.3 % (ref 11.5–15.5)
WBC: 11.2 10*3/uL — ABNORMAL HIGH (ref 4.0–10.5)

## 2016-05-28 LAB — CBC WITH DIFFERENTIAL/PLATELET
BASOS ABS: 0 10*3/uL (ref 0.0–0.1)
Basophils Relative: 0 %
EOS ABS: 0 10*3/uL (ref 0.0–0.7)
Eosinophils Relative: 0 %
HCT: 38.1 % — ABNORMAL LOW (ref 39.0–52.0)
HEMOGLOBIN: 11.9 g/dL — AB (ref 13.0–17.0)
LYMPHS ABS: 1.1 10*3/uL (ref 0.7–4.0)
LYMPHS PCT: 9 %
MCH: 23.1 pg — AB (ref 26.0–34.0)
MCHC: 31.2 g/dL (ref 30.0–36.0)
MCV: 73.8 fL — ABNORMAL LOW (ref 78.0–100.0)
MONO ABS: 0.6 10*3/uL (ref 0.1–1.0)
Monocytes Relative: 5 %
NEUTROS ABS: 10.8 10*3/uL — AB (ref 1.7–7.7)
Neutrophils Relative %: 86 %
PLATELETS: 145 10*3/uL — AB (ref 150–400)
RBC: 5.16 MIL/uL (ref 4.22–5.81)
RDW: 14.9 % (ref 11.5–15.5)
WBC: 12.5 10*3/uL — ABNORMAL HIGH (ref 4.0–10.5)

## 2016-05-28 LAB — LIPID PANEL
CHOL/HDL RATIO: 5.7 ratio
Cholesterol: 166 mg/dL (ref 0–200)
HDL: 29 mg/dL — AB (ref >40)
LDL CALC: 109 mg/dL — AB (ref 0–99)
Triglycerides: 139 mg/dL (ref <150)
VLDL: 28 mg/dL (ref 0–40)

## 2016-05-28 LAB — POCT I-STAT, CHEM 8
BUN: 15 mg/dL (ref 6–20)
Calcium, Ion: 1.09 mmol/L — ABNORMAL LOW (ref 1.15–1.40)
Chloride: 102 mmol/L (ref 101–111)
Creatinine, Ser: 1 mg/dL (ref 0.61–1.24)
Glucose, Bld: 138 mg/dL — ABNORMAL HIGH (ref 65–99)
HCT: 35 % — ABNORMAL LOW (ref 39.0–52.0)
Hemoglobin: 11.9 g/dL — ABNORMAL LOW (ref 13.0–17.0)
Potassium: 2.9 mmol/L — ABNORMAL LOW (ref 3.5–5.1)
Sodium: 141 mmol/L (ref 135–145)
TCO2: 23 mmol/L (ref 0–100)

## 2016-05-28 LAB — TROPONIN I
TROPONIN I: 57.41 ng/mL — AB (ref <0.03)
Troponin I: 0.06 ng/mL (ref <0.03)
Troponin I: >65 ng/mL (ref <0.03)

## 2016-05-28 LAB — PROTIME-INR
INR: 1.16
Prothrombin Time: 14.9 seconds (ref 11.4–15.2)

## 2016-05-28 LAB — TSH: TSH: 0.813 u[IU]/mL (ref 0.350–4.500)

## 2016-05-28 LAB — APTT: aPTT: 122 seconds — ABNORMAL HIGH (ref 24–36)

## 2016-05-28 LAB — MRSA PCR SCREENING: MRSA BY PCR: NEGATIVE

## 2016-05-28 LAB — POCT ACTIVATED CLOTTING TIME: Activated Clotting Time: 599 s

## 2016-05-28 SURGERY — LEFT HEART CATH AND CORONARY ANGIOGRAPHY
Anesthesia: LOCAL

## 2016-05-28 MED ORDER — HYDROCODONE-ACETAMINOPHEN 5-325 MG PO TABS
1.0000 | ORAL_TABLET | Freq: Four times a day (QID) | ORAL | Status: DC | PRN
  Administered 2016-05-29: 1 via ORAL
  Filled 2016-05-28: qty 1

## 2016-05-28 MED ORDER — IOPAMIDOL (ISOVUE-370) INJECTION 76%
INTRAVENOUS | Status: AC
  Filled 2016-05-28: qty 125

## 2016-05-28 MED ORDER — ATORVASTATIN CALCIUM 80 MG PO TABS
80.0000 mg | ORAL_TABLET | Freq: Every day | ORAL | Status: DC
  Administered 2016-05-28 – 2016-05-30 (×3): 80 mg via ORAL
  Filled 2016-05-28 (×3): qty 1

## 2016-05-28 MED ORDER — HEPARIN SODIUM (PORCINE) 5000 UNIT/ML IJ SOLN: 4000.0000 [IU] | INTRAMUSCULAR | Status: DC

## 2016-05-28 MED ORDER — HEPARIN (PORCINE) IN NACL 2-0.9 UNIT/ML-% IJ SOLN
INTRAMUSCULAR | Status: AC
  Filled 2016-05-28: qty 500

## 2016-05-28 MED ORDER — NITROGLYCERIN 0.4 MG SL SUBL: 0.4000 mg | SUBLINGUAL_TABLET | SUBLINGUAL | Status: DC | PRN

## 2016-05-28 MED ORDER — IOPAMIDOL (ISOVUE-370) INJECTION 76%
INTRAVENOUS | Status: AC
  Filled 2016-05-28: qty 50

## 2016-05-28 MED ORDER — ASPIRIN EC 81 MG PO TBEC
81.0000 mg | DELAYED_RELEASE_TABLET | Freq: Every day | ORAL | Status: DC
  Administered 2016-05-30: 81 mg via ORAL
  Filled 2016-05-28: qty 1

## 2016-05-28 MED ORDER — SODIUM CHLORIDE 0.9% FLUSH
3.0000 mL | Freq: Two times a day (BID) | INTRAVENOUS | Status: DC
  Administered 2016-05-28 – 2016-05-29 (×4): 3 mL via INTRAVENOUS

## 2016-05-28 MED ORDER — METOPROLOL TARTRATE 12.5 MG HALF TABLET
12.5000 mg | ORAL_TABLET | Freq: Two times a day (BID) | ORAL | Status: DC
  Administered 2016-05-28 – 2016-05-30 (×6): 12.5 mg via ORAL
  Filled 2016-05-28 (×6): qty 1

## 2016-05-28 MED ORDER — NOREPINEPHRINE BITARTRATE 1 MG/ML IV SOLN
INTRAVENOUS | Status: AC
  Filled 2016-05-28: qty 4

## 2016-05-28 MED ORDER — SODIUM CHLORIDE 0.9 % WEIGHT BASED INFUSION: 1.0000 mL/kg/h | INTRAVENOUS | Status: AC

## 2016-05-28 MED ORDER — VERAPAMIL HCL 2.5 MG/ML IV SOLN
INTRAVENOUS | Status: DC | PRN
  Administered 2016-05-28: 10 mL via INTRA_ARTERIAL

## 2016-05-28 MED ORDER — NITROGLYCERIN 1 MG/10 ML FOR IR/CATH LAB
INTRA_ARTERIAL | Status: AC
  Filled 2016-05-28: qty 10

## 2016-05-28 MED ORDER — MORPHINE SULFATE (PF) 2 MG/ML IV SOLN
1.0000 mg | INTRAVENOUS | Status: AC | PRN
  Administered 2016-05-28 (×2): 2 mg via INTRAVENOUS
  Filled 2016-05-28 (×2): qty 1

## 2016-05-28 MED ORDER — NOREPINEPHRINE BITARTRATE 1 MG/ML IV SOLN
INTRAVENOUS | Status: DC | PRN
  Administered 2016-05-28: 2 ug/min via INTRAVENOUS

## 2016-05-28 MED ORDER — SODIUM CHLORIDE 0.9 % IV SOLN
INTRAVENOUS | Status: DC | PRN
  Administered 2016-05-28: 999 mL via INTRAVENOUS

## 2016-05-28 MED ORDER — ENOXAPARIN SODIUM 40 MG/0.4ML ~~LOC~~ SOLN
40.0000 mg | SUBCUTANEOUS | Status: DC
  Administered 2016-05-29 – 2016-05-30 (×2): 40 mg via SUBCUTANEOUS
  Filled 2016-05-28 (×2): qty 0.4

## 2016-05-28 MED ORDER — SODIUM CHLORIDE 0.9 % IV SOLN: 250.0000 mL | INTRAVENOUS | Status: DC | PRN

## 2016-05-28 MED ORDER — NOREPINEPHRINE BITARTRATE 1 MG/ML IV SOLN
0.0000 ug/min | INTRAVENOUS | Status: DC
  Filled 2016-05-28: qty 4

## 2016-05-28 MED ORDER — BIVALIRUDIN BOLUS VIA INFUSION - CUPID
INTRAVENOUS | Status: DC | PRN
  Administered 2016-05-28: 64.5 mg via INTRAVENOUS

## 2016-05-28 MED ORDER — SODIUM CHLORIDE 0.9 % IV SOLN
INTRAVENOUS | Status: DC | PRN
  Administered 2016-05-28: 1.75 mg/kg/h via INTRAVENOUS

## 2016-05-28 MED ORDER — FAMOTIDINE 20 MG PO TABS: 20.0000 mg | ORAL_TABLET | Freq: Every day | ORAL | Status: DC

## 2016-05-28 MED ORDER — LIDOCAINE HCL (PF) 1 % IJ SOLN
INTRAMUSCULAR | Status: AC
  Filled 2016-05-28: qty 30

## 2016-05-28 MED ORDER — HEPARIN (PORCINE) IN NACL 2-0.9 UNIT/ML-% IJ SOLN
INTRAMUSCULAR | Status: AC
  Filled 2016-05-28: qty 1000

## 2016-05-28 MED ORDER — SODIUM CHLORIDE 0.9 % WEIGHT BASED INFUSION: 1.0000 mL/kg/h | INTRAVENOUS | Status: DC

## 2016-05-28 MED ORDER — TICAGRELOR 90 MG PO TABS
ORAL_TABLET | ORAL | Status: AC
  Filled 2016-05-28: qty 1

## 2016-05-28 MED ORDER — SODIUM CHLORIDE 0.9% FLUSH: 3.0000 mL | INTRAVENOUS | Status: DC | PRN

## 2016-05-28 MED ORDER — ACETAMINOPHEN 325 MG PO TABS
650.0000 mg | ORAL_TABLET | ORAL | Status: DC | PRN
  Administered 2016-05-30: 650 mg via ORAL
  Filled 2016-05-28: qty 2

## 2016-05-28 MED ORDER — TICAGRELOR 90 MG PO TABS
ORAL_TABLET | ORAL | Status: DC | PRN
  Administered 2016-05-28: 180 mg via ORAL

## 2016-05-28 MED ORDER — VERAPAMIL HCL 2.5 MG/ML IV SOLN
INTRAVENOUS | Status: AC
  Filled 2016-05-28: qty 2

## 2016-05-28 MED ORDER — SODIUM CHLORIDE 0.9 % IV SOLN: 10.0000 mL/h | INTRAVENOUS | Status: DC

## 2016-05-28 MED ORDER — TICAGRELOR 90 MG PO TABS
90.0000 mg | ORAL_TABLET | Freq: Two times a day (BID) | ORAL | Status: DC
  Administered 2016-05-28 – 2016-05-30 (×5): 90 mg via ORAL
  Filled 2016-05-28 (×5): qty 1

## 2016-05-28 MED ORDER — BIVALIRUDIN 250 MG IV SOLR
INTRAVENOUS | Status: AC
  Filled 2016-05-28: qty 250

## 2016-05-28 MED ORDER — PNEUMOCOCCAL VAC POLYVALENT 25 MCG/0.5ML IJ INJ
0.5000 mL | INJECTION | INTRAMUSCULAR | Status: DC
  Filled 2016-05-28: qty 0.5

## 2016-05-28 MED ORDER — ONDANSETRON HCL 4 MG/2ML IJ SOLN: 4.0000 mg | Freq: Four times a day (QID) | INTRAMUSCULAR | Status: DC | PRN

## 2016-05-28 SURGICAL SUPPLY — 26 items
BALLN EMERGE MR 2.0X12 (BALLOONS) ×2
BALLN EUPHORA RX 2.5X15 (BALLOONS) ×2
BALLN ~~LOC~~ EUPHORA RX 2.5X12 (BALLOONS) ×2
BALLN ~~LOC~~ EUPHORA RX 3.0X12 (BALLOONS) ×2
BALLOON EMERGE MR 2.0X12 (BALLOONS) IMPLANT
BALLOON EUPHORA RX 2.5X15 (BALLOONS) IMPLANT
BALLOON ~~LOC~~ EUPHORA RX 2.5X12 (BALLOONS) IMPLANT
BALLOON ~~LOC~~ EUPHORA RX 3.0X12 (BALLOONS) IMPLANT
CATH INFINITI 5FR ANG PIGTAIL (CATHETERS) ×1 IMPLANT
CATH INFINITI 5FR JL4 (CATHETERS) ×1 IMPLANT
CATH INFINITI JR4 5F (CATHETERS) ×1 IMPLANT
CATH VISTA GUIDE 6FR JR4 SH (CATHETERS) ×1 IMPLANT
DEVICE RAD COMP TR BAND LRG (VASCULAR PRODUCTS) ×1 IMPLANT
GLIDESHEATH SLEND SS 6F .021 (SHEATH) ×1 IMPLANT
GUIDE CATH RUNWAY 6FR CLS3.5 (CATHETERS) ×1 IMPLANT
KIT ENCORE 26 ADVANTAGE (KITS) ×1 IMPLANT
KIT HEART LEFT (KITS) ×2 IMPLANT
PACK CARDIAC CATHETERIZATION (CUSTOM PROCEDURE TRAY) ×2 IMPLANT
STENT PROMUS PREM MR 2.25X16 (Permanent Stent) ×1 IMPLANT
STENT PROMUS PREM MR 2.75X16 (Permanent Stent) ×1 IMPLANT
SYR MEDRAD MARK V 150ML (SYRINGE) ×2 IMPLANT
TRANSDUCER W/STOPCOCK (MISCELLANEOUS) ×2 IMPLANT
TUBING CIL FLEX 10 FLL-RA (TUBING) ×2 IMPLANT
WIRE ASAHI PROWATER 180CM (WIRE) ×1 IMPLANT
WIRE EMERALD 3MM-J .035X260CM (WIRE) ×1 IMPLANT
WIRE HI TORQ VERSACORE-J 145CM (WIRE) ×1 IMPLANT

## 2016-05-28 NOTE — H&P (Signed)
History & Physical    Patient ID: Brian Harvey MRN: [id number], DOB/AGE: 1971-06-25   Admit date: 05/28/2016   Primary Physician: Pcp Not In System Primary Cardiologist: New - seen by P. [place], MD [place] [facility]  Patient Profile    45 y/o ? with a h/o tob/etoh abuse and HTN, [place] [facility] [place] and STEMI.  Past Medical History    Past Medical History:  Diagnosis Date  . Alcohol abuse   . Essential hypertension   . History of pancreatitis (in setting of alcoholism)   . Tobacco abuse     History reviewed. No pertinent surgical history.   Allergies  Allergies  Allergen Reactions  . Ace Inhibitors Cough    History of Present Illness    45 y/o ? with a h/o HTN, Tob abuse, ETOH abuse, and pancreatitis.  For the past 4 months, [facility]  He was in his usoh until early this AM, when he developed c/p and diaphoresis.  EMS was called and ECG showed ST elevation.  Code STEMI was activated and [facility]  Home Medications    Prior to Admission medications   Medication Sig Start Date End Date Taking? Authorizing Provider  Aspirin-Salicylamide-Caffeine (BC HEADACHE) 325-95-16 MG TABS Take 1 packet by mouth 2 (two) times daily as needed (for pain).    Historical Provider, MD  hydrochlorothiazide (HYDRODIURIL) 25 MG tablet Take 1 tablet (25 mg total) by mouth daily. 11/29/14   Hope Orlene OchM Neese, NP  HYDROcodone-acetaminophen (NORCO) 5-325 MG per tablet Take 1 tablet by mouth every 6 (six) hours as needed for moderate pain. 11/29/14   Hope Orlene OchM Neese, NP  ranitidine (ZANTAC) 150 MG capsule Take 1 capsule (150 mg total) by mouth daily. 11/29/14   Hope Orlene OchM Neese, NP    Family History    Family History  Problem Relation Age of Onset  . Diabetes Father     Social History    Social History   Social History  . Marital status: Single    Spouse name: N/A  . Number of  children: N/A  . Years of education: N/A   Occupational History  . Not on file.   Social History Main Topics  . Smoking status: Former Smoker    Packs/day: 1.50    Years: 25.00    Types: Cigarettes    Quit date: 01/26/2016  . Smokeless tobacco: Never Used     Comment: in prison x 4 months, currently not smoking.  . Alcohol use 8.4 oz/week    14 Cans of beer per week     Comment: in prison x 4 months, currently not drinking.  . Drug use: No  . Sexual activity: Not on file   Other Topics Concern  . Not on file   Social History Narrative  . No narrative on file     Review of Systems    General:  No chills, fever, night sweats or weight changes.  Cardiovascular:  +++ chest pain and diaphoresis, dyspnea on exertion, edema, orthopnea, palpitations, paroxysmal nocturnal dyspnea. Dermatological: No rash, lesions/masses Respiratory: No cough, dyspnea Urologic: No hematuria, dysuria Abdominal:   No nausea, vomiting, diarrhea, bright red blood per rectum, melena, or hematemesis Neurologic:  No visual changes, wkns, changes in mental status. All other systems reviewed and are otherwise negative except as noted above.  Physical Exam  SpO2 100 %. Afebrile, 72, 81/49, SpO2 98%, 2lpm General: Pleasant, NAD Psych: Normal affect. Neuro: Alert and oriented X 3. Moves all extremities spontaneously. HEENT: Normal  Neck: Supple without bruits or JVD. Lungs:  Resp regular and unlabored, CTA. Heart: RRR no s3, s4, or murmurs. Abdomen: Soft, non-tender, non-distended, BS + x 4.  Extremities: No clubbing, cyanosis or edema. DP/PT/Radials 2+ and equal bilaterally.  Labs    All labs pending   Radiology Studies    No results found.  ECG & Cardiac Imaging    RSR, 90, 2 mm inf ST elevation with anterolateral ST dep and TWI  Assessment & Plan    1.  Acute inferior STEMI:  Pt presented on tx [facility] after developing c/p and diaphoresis earlier this AM.  Emergent  cath ongoing  occluded LCX.  Now s/p PCI.  Hypotensive throughout procedure.  Plan to admit to CCU.  Cont asa, brilinta.  Add high potency statin.  Cannot add  blocker @ this time 2/2 hypotension.  Prior cough with acei.  F/u echo.  Eventual cardiac rehab.  2.  Hypertensive Heart Dzs/Hypotension:  H/o HTN on HCTZ @ home/prison.  Hypotensive in setting of above.  Hold all antihypertensives for the time being.  May require pressors if hypotension worsens.  3.  Lipids:  Status unknown.  F/u lipids/lfts.  Adding high potency statin in setting of STEMI.  4.  Tob/ETOH abuse:  Off both since imprisonment 4 mos ago.  Signed, Nicolasa Ducking, NP 05/28/2016, 6:50 AM   Patient seen and examined and history reviewed. Agree with above findings and plan. 45 yo BM brought from jail today with acute chest pain. Ecg showed 1 mm ST elevation in leads 3 and avF consistent with acute inferior STEMI. Patient has ongoing severe chest pain. He is hypotensive consistent with cardiogenic shock. Will proceed to emergent cardiac cath/PCI.   Juan Olthoff [place], MDFACC 05/28/2016 7:47 AM

## 2016-05-28 NOTE — ED Notes (Signed)
Radiolucent zoll pads applied at this time

## 2016-05-28 NOTE — Progress Notes (Signed)
Patient Name: Brian Harvey Date of Encounter: 05/28/2016  Hospital Problem List     Principal Problem:   Acute ST elevation myocardial infarction (STEMI) involving left circumflex coronary artery [facility]) Active Problems:   Cardiogenic shock (HCC)   CAD S/P PCI to coDom Cx & mRCA with DES   Essential hypertension   Tobacco use    Subjective   Resting comfortably - wrapped up in warm blankets -- feels better  Inpatient Medications    . [START ON 05/30/2016] aspirin EC  81 mg Oral Daily  . atorvastatin  80 mg Oral q1800  . [START ON 05/29/2016] enoxaparin (LOVENOX) injection  40 mg Subcutaneous Q24H  . sodium chloride flush  3 mL Intravenous Q12H  . ticagrelor  90 mg Oral BID    Vital Signs    Vitals:   05/28/16 0728 05/28/16 0730 05/28/16 0735 05/28/16 0740  BP: 115/70 113/68    Pulse: 93 (!) 108 (!) 0 (!) 172  Resp: 19 (!) 0 (!) 26 (!) 0  SpO2: (!) 0% (!) 0% (!) 0% (!) 0%  Weight:    189 lb 9.5 oz (86 kg)   No intake or output data in the 24 hours ending 05/28/16 0826 Filed Weights   05/28/16 0740  Weight: 189 lb 9.5 oz (86 kg)    Physical Exam    GEN: Well nourished, well developed, in mild distress.  HEENT: normal.  Neck: Supple, no JVD, carotid bruits, or masses. Cardiac: RRR, Normal S1&S2no murmurs, rubs, or gallops. No clubbing, cyanosis, edema.  Radials/DP/PT 2+ and equal bilaterally.  Respiratory:  Respirations regular and unlabored, clear to auscultation bilaterally. GI: Soft, nontender, nondistended, BS + x 4. MS: no deformity or atrophy. Skin: warm and dry, no rash. Neuro:  Strength and sensation are intact. Psych: Normal affect.  Labs    CBC  Recent Labs  05/28/16 0610  WBC 11.2*  NEUTROABS 7.1  HGB 12.2*  HCT 38.3*  MCV 73.4*  PLT 165   Basic Metabolic Panel  Recent Labs  05/28/16 0610  NA 137  K 3.2*  CL 101  CO2 26  GLUCOSE 147*  BUN 14  CREATININE 1.25*  CALCIUM 8.8*   Liver Function Tests  Recent Labs   05/28/16 0610  AST 19  ALT 20  ALKPHOS 56  BILITOT 0.6  PROT 6.4*  ALBUMIN 3.8   No results for input(s): LIPASE, AMYLASE in the last 72 hours. Cardiac Enzymes  Recent Labs  05/28/16 0610  TROPONINI 0.06*   BNP Invalid input(s): POCBNP D-Dimer No results for input(s): DDIMER in the last 72 hours. Hemoglobin A1C No results for input(s): HGBA1C in the last 72 hours. Fasting Lipid Panel  Recent Labs  05/28/16 0610  CHOL 166  HDL 29*  LDLCALC 109*  TRIG 139  CHOLHDL 5.7   Thyroid Function Tests No results for input(s): TSH, T4TOTAL, T3FREE, THYROIDAB in the last 72 hours.  Invalid input(s): FREET3  Telemetry    NSR to ~Sinus Tachy  ECG    NSR, 89. TWI in II, III, aVF with minimal ST depression in V5 & V6 - ? Evolutionary changes of Inferior STEMI  Cadiology   Cardiac CATH - PCI 1. Severe 3 vessel obstructive CAD    - 80% stenosis of the second diagonal    - 99% stenosis of a small sub-branch of the ramus intermediate    - 100% occlusion of the mid LCx. This is the culprit lesion    - 99%  mid RCA 2. Normal LV function with moderately elevated LVEDP - 27 mmHg (in setting of Cardiogenic Shock) 3. Moderate MR 4. Successful stenting of the mid LCx with DES - Promus 2.75 x 16 (3.0) 5. Successful stenting of the mid RCA with DES - Promus 2.25 x 16 (2.5)  Plan: transfer to ICU. Continue IV levophed for BP support. Check Echo today to assess MR - I suspect papillary muscle ischemia may have contributed to presentation with shock. DAPT for one year.            Radiology    No results found.  Patient Summary     45 y/o ? with a h/o HTN, Tob abuse, ETOH abuse, and pancreatitis.  For the past 4 months, [facility]  He was in his usoh until early this AM, when he developed c/p and diaphoresis.  EMS was called and ECG showed ST elevation.  Code STEMI was activated and [facility]. 100%  occluded large, co-dominant Cx & 99% mid RCA complicated by Cardiogenic Shock requiring Levophed. DES PCI of both Cx & RCA due to shock.  Assessment & Plan    Principal Problem:   Acute ST elevation myocardial infarction (STEMI) involving left circumflex coronary artery (HCC) --> CAD S/P PCI to coDom Cx & mRCA with DES complicated by Cardiogenic shock (HCC)  Remains on Levophed for now - hope to wean off during the day. -- will likely start low dose BB   MR noted on LV Gram, suspect Papillary muscle dysfunction - check 2dEcho to assess  On ASA, Brilinta & high dose Atorvastatin    Essential hypertension - on HCTZ @ home (cough with ACE-I) -- will hopefully initiate low dose BB today.  EF preserved on LV Gram - will hold off on ARB for now.    Tobacco use - cessation counseling   Signed, Bryan Lemmaavid Janaisa Birkland, MD

## 2016-05-28 NOTE — ED Triage Notes (Signed)
[facility] [place] [facility] with [facility] for diaphoresis, chest tightness, sob, and nausea; pain rating 10/10; EMS initiated bilat IV access, gave 324mg  ASA and 3 SL NTG; pt reported pain decreased to 7/10; pt reports only hx is HTN (doesnt know name of HTN medication)

## 2016-05-28 NOTE — Progress Notes (Signed)
CRITICAL VALUE ALERT  Critical value received:  Troponin 57.4  Date of notification:  05/28/16  Time of notification:  1200  Critical value read back:Yes.    Nurse who received alert:  Cristopher PeruMegan B  MD notified (1st page):  Herbie BaltimoreHarding  Time of first page:  1202  Paged of critical and pt having 5/10 CP "different than what brought in to hospital." MD ordered morphine. Informed MD of pt having 5-10 run beats of vtach. Will continue to monitor.

## 2016-05-28 NOTE — ED Notes (Signed)
4,000 units heparin administered via IV push at this time by Phillips GroutYasemia Lebron, RN

## 2016-05-28 NOTE — ED Notes (Signed)
[facility] at this time with RN, EMT, and patient on Zoll monitor

## 2016-05-28 NOTE — Progress Notes (Signed)
TR band removed at 1300. Site level 0. Gauze and clear Tegaderm applied. BP 143/96. PT instructed to still treat arm as broken and to elevate. Will continue to monitor.

## 2016-05-28 NOTE — Progress Notes (Signed)
PT SECURED BY SHERIFF. INCARCERATED

## 2016-05-28 NOTE — ED Provider Notes (Signed)
[facility]-EMERGENCY DEPT Provider Note   CSN: [id number] Arrival date & time: 05/28/16  0554     History   Chief Complaint Chief Complaint  Patient presents with  . Code STEMI    HPI Brian Harvey is a 45 y.o. male.  HPI  Patient with a history of hypertension and tobacco abuse presents as a STEMI by EMS. Patient is currently in jail. Had onset of chest pain approximately one hour prior to arrival. Inferior ST elevations. Patient received aspirin and 3 nitroglycerin with improvement of pain to 4 out of 10. Currently he rates pain at 7 out of 10. Denies shortness of breath or recent illnesses. Denies history of coronary artery disease.  Level V caveat for acuity of condition  Past Medical History:  Diagnosis Date  . Alcohol abuse   . Essential hypertension   . History of pancreatitis (in setting of alcoholism)   . Tobacco abuse     Patient Active Problem List   Diagnosis Date Noted  . Acute ST elevation myocardial infarction (STEMI) involving left circumflex coronary artery (HCC) 05/28/2016  . Cardiogenic shock (HCC) 05/28/2016  . HTN (hypertension) 05/28/2016  . Tobacco use 05/28/2016    History reviewed. No pertinent surgical history.     Home Medications    Prior to Admission medications   Medication Sig Start Date End Date Taking? Authorizing Provider  Aspirin-Salicylamide-Caffeine (BC HEADACHE) 325-95-16 MG TABS Take 1 packet by mouth 2 (two) times daily as needed (for pain).    Historical Provider, MD  hydrochlorothiazide (HYDRODIURIL) 25 MG tablet Take 1 tablet (25 mg total) by mouth daily. 11/29/14   Hope Orlene Och, NP  HYDROcodone-acetaminophen (NORCO) 5-325 MG per tablet Take 1 tablet by mouth every 6 (six) hours as needed for moderate pain. 11/29/14   Hope Orlene Och, NP  ranitidine (ZANTAC) 150 MG capsule Take 1 capsule (150 mg total) by mouth daily. 11/29/14   Hope Orlene Och, NP    Family History Family History  Problem Relation Age of Onset  . Diabetes Father      Social History Social History  Substance Use Topics  . Smoking status: Former Smoker    Packs/day: 1.50    Years: 25.00    Types: Cigarettes    Quit date: 01/26/2016  . Smokeless tobacco: Never Used     Comment: in prison x 4 months, currently not smoking.  . Alcohol use 8.4 oz/week    14 Cans of beer per week     Comment: in prison x 4 months, currently not drinking.     Allergies   Ace inhibitors   Review of Systems Review of Systems  Constitutional: Negative for fever.  Respiratory: Positive for shortness of breath.   Cardiovascular: Positive for chest pain.  All other systems reviewed and are negative.    Physical Exam Updated Vital Signs BP 113/68   Pulse (!) 172   Resp (!) 0   SpO2 (!) 0%   Physical Exam  Constitutional: He is oriented to person, place, and time.  Uncomfortable appearing  HENT:  Head: Normocephalic and atraumatic.  Cardiovascular: Normal rate, regular rhythm and normal heart sounds.   No murmur heard. Pulmonary/Chest: Effort normal and breath sounds normal. No respiratory distress. He has no wheezes.  Neurological: He is alert and oriented to person, place, and time.  Skin: Skin is warm and dry.  Psychiatric: He has a normal mood and affect.  Nursing note and vitals reviewed.    ED Treatments /  Results  Labs (all labs ordered are listed, but only abnormal results are displayed) Labs Reviewed  CBC - Abnormal; Notable for the following:       Result Value   WBC 11.2 (*)    Hemoglobin 12.2 (*)    HCT 38.3 (*)    MCV 73.4 (*)    MCH 23.4 (*)    All other components within normal limits  APTT - Abnormal; Notable for the following:    aPTT 122 (*)    All other components within normal limits  COMPREHENSIVE METABOLIC PANEL - Abnormal; Notable for the following:    Potassium 3.2 (*)    Glucose, Bld 147 (*)    Creatinine, Ser 1.25 (*)    Calcium 8.8 (*)    Total Protein 6.4 (*)    All other components within normal limits   TROPONIN I - Abnormal; Notable for the following:    Troponin I 0.06 (*)    All other components within normal limits  LIPID PANEL - Abnormal; Notable for the following:    HDL 29 (*)    LDL Cholesterol 109 (*)    All other components within normal limits  MRSA PCR SCREENING  DIFFERENTIAL  PROTIME-INR    EKG  EKG Interpretation  Date/Time:  Wednesday May 28 2016 06:10:13 EDT Ventricular Rate:  96 PR Interval:  166 QRS Duration: 108 QT Interval:  342 QTC Calculation: 432 R Axis:   41 Text Interpretation:   Critical Test Result: STEMI Normal sinus rhythm ST elevation consider inferior injury or acute infarct  ACUTE MI / STEMI Consider right ventricular involvement in acute inferior infarct Abnormal ECG Confirmed by Brian Jurgens  MD, Toni AmendOURTNEY ([id number]) on 05/28/2016 8:12:34 AM       Radiology No results found.  Procedures Procedures (including critical care time)  CRITICAL CARE Performed by: Shon BatonHORTON, Kimberl Vig F   Total critical care time: 25 minutes  Critical care time was exclusive of separately billable procedures and treating other patients.  Critical care was necessary to treat or prevent imminent or life-threatening deterioration.  Critical care was time spent personally by me on the following activities: development of treatment plan with patient and/or surrogate as well as nursing, discussions with consultants, evaluation of patient's response to treatment, examination of patient, obtaining history from patient or surrogate, ordering and performing treatments and interventions, ordering and review of laboratory studies, ordering and review of radiographic studies, pulse oximetry and re-evaluation of patient's condition.   Medications Ordered in ED Medications  0.9 %  sodium chloride infusion (not administered)  heparin injection 4,000 Units ( Intravenous MAR Unhold 05/28/16 0743)  norepinephrine (LEVOPHED) 4 mg in dextrose 5 % 250 mL (0.016 mg/mL) infusion (5 mcg/min  Intravenous Rate/Dose Change 05/28/16 0756)     Initial Impression / Assessment and Plan / ED Course  I have reviewed the triage vital signs and the nursing notes.  Pertinent labs & imaging results that were available during my care of the patient were reviewed by me and considered in my medical decision making (see chart for details).  Clinical Course    Patient presents as a code STEMI from EMS. EKG here confirms inferior ST elevation. He was given IV heparin. Cardiac catheterization team is here and patient was sent emergently to the cardiac catheterization lab.  Final Clinical Impressions(s) / ED Diagnoses   Final diagnoses:  ST elevation myocardial infarction (STEMI), unspecified artery [facility](HCC)    New Prescriptions Current Discharge Medication List  Shon Baton, MD 05/28/16 [phone]

## 2016-05-28 NOTE — Care Management Note (Signed)
Case Management Note  Patient Details  Name: Brian Harvey MRN: [id number] Date of Birth: 09-Nov-1970  Subjective/Objective:          Adm w mi          Action/Plan: [facility] [place] [facility]  Expected Discharge Date:                  Expected Discharge Plan:  Corrections Facility  In-House Referral:     Discharge planning Services  CM Consult, Medication Assistance  Post Acute Care Choice:    Choice offered to:     DME Arranged:    DME Agency:     HH Arranged:    HH Agency:     Status of Service:  In process, will continue to follow  If discussed at Long Length of Stay Meetings, dates discussed:    Additional Comments:gave 30day free and copay card for brilinta. Will cont to follow  Hanley HaysDowell, Shawna Kiener T, RN 05/28/2016, 10:47 AM

## 2016-05-29 ENCOUNTER — Inpatient Hospital Stay (HOSPITAL_COMMUNITY)

## 2016-05-29 DIAGNOSIS — Z9861 Coronary angioplasty status: Secondary | ICD-10-CM

## 2016-05-29 DIAGNOSIS — R079 Chest pain, unspecified: Secondary | ICD-10-CM

## 2016-05-29 HISTORY — PX: TRANSTHORACIC ECHOCARDIOGRAM: SHX275

## 2016-05-29 LAB — CBC
HCT: 35.6 % — ABNORMAL LOW (ref 39.0–52.0)
HEMOGLOBIN: 11 g/dL — AB (ref 13.0–17.0)
MCH: 22.8 pg — ABNORMAL LOW (ref 26.0–34.0)
MCHC: 30.9 g/dL (ref 30.0–36.0)
MCV: 73.9 fL — ABNORMAL LOW (ref 78.0–100.0)
Platelets: 149 10*3/uL — ABNORMAL LOW (ref 150–400)
RBC: 4.82 MIL/uL (ref 4.22–5.81)
RDW: 15.1 % (ref 11.5–15.5)
WBC: 8.4 10*3/uL (ref 4.0–10.5)

## 2016-05-29 LAB — ECHOCARDIOGRAM COMPLETE
AVLVOTPG: 7 mmHg
Ao-asc: 30 cm
CHL CUP STROKE VOLUME: 46 mL
E/e' ratio: 8.42
EWDT: 268 ms
FS: 27 % — AB (ref 28–44)
HEIGHTINCHES: 65 in
IV/PV OW: 0.79
LA ID, A-P, ES: 32 mm
LA diam end sys: 32 mm
LA diam index: 1.68 cm/m2
LA vol index: 20.4 mL/m2
LAVOL: 38.8 mL
LAVOLA4C: 43.3 mL
LDCA: 3.14 cm2
LV E/e' medial: 8.42
LV E/e'average: 8.42
LV PW d: 18.4 mm — AB (ref 0.6–1.1)
LV SIMPSON'S DISK: 57
LV TDI E'LATERAL: 11.3
LV dias vol: 80 mL (ref 62–150)
LV e' LATERAL: 11.3 cm/s
LVDIAVOLIN: 42 mL/m2
LVOT SV: 73 mL
LVOT VTI: 23.1 cm
LVOT peak vel: 129 cm/s
LVOTD: 20 mm
LVSYSVOL: 34 mL (ref 21–61)
LVSYSVOLIN: 18 mL/m2
MV Dec: 268
MV Peak grad: 4 mmHg
MV pk E vel: 95.1 m/s
MVPKAVEL: 90.7 m/s
RV LATERAL S' VELOCITY: 12 cm/s
RV TAPSE: 18.5 mm
TDI e' medial: 5.87
WEIGHTICAEL: 2913.6 [oz_av]

## 2016-05-29 LAB — BASIC METABOLIC PANEL
ANION GAP: 7 (ref 5–15)
BUN: 10 mg/dL (ref 6–20)
CALCIUM: 8.7 mg/dL — AB (ref 8.9–10.3)
CO2: 27 mmol/L (ref 22–32)
Chloride: 105 mmol/L (ref 101–111)
Creatinine, Ser: 0.99 mg/dL (ref 0.61–1.24)
GLUCOSE: 102 mg/dL — AB (ref 65–99)
Potassium: 3.9 mmol/L (ref 3.5–5.1)
SODIUM: 139 mmol/L (ref 135–145)

## 2016-05-29 LAB — TROPONIN I: Troponin I: >65 ng/mL (ref <0.03)

## 2016-05-29 LAB — LIPID PANEL
CHOLESTEROL: 156 mg/dL (ref 0–200)
HDL: 28 mg/dL — ABNORMAL LOW (ref >40)
LDL Cholesterol: 95 mg/dL (ref 0–99)
TRIGLYCERIDES: 163 mg/dL — AB (ref <150)
Total CHOL/HDL Ratio: 5.6 RATIO
VLDL: 33 mg/dL (ref 0–40)

## 2016-05-29 LAB — HEMOGLOBIN A1C
HEMOGLOBIN A1C: 5.9 % — AB (ref 4.8–5.6)
MEAN PLASMA GLUCOSE: 123 mg/dL

## 2016-05-29 NOTE — Progress Notes (Signed)
Patient Name: Brian DuhamelScottie Harvey XXXPass Date of Encounter: 05/29/2016  Hospital Problem List     Principal Problem:   Acute ST elevation myocardial infarction (STEMI) involving left circumflex coronary artery [facility](HCC) Active Problems:   Cardiogenic shock (HCC)   CAD S/P PCI to coDom Cx & mRCA with DES   Essential hypertension   Tobacco use    Subjective   Resting comfortably - no further CP. Breathing fine.  BP stabilized yesterday, low dose BB started.   Short run of NSVT yesterday, but no further episodes.  Inpatient Medications    . [START ON 05/30/2016] aspirin EC  81 mg Oral Daily  . atorvastatin  80 mg Oral q1800  . enoxaparin (LOVENOX) injection  40 mg Subcutaneous Q24H  . metoprolol tartrate  12.5 mg Oral BID  . pneumococcal 23 valent vaccine  0.5 mL Intramuscular Tomorrow-1000  . sodium chloride flush  3 mL Intravenous Q12H  . ticagrelor  90 mg Oral BID    Vital Signs    Vitals:   05/29/16 0500 05/29/16 0600 05/29/16 0700 05/29/16 0800  BP: 110/85 118/79 117/75   Pulse: 70 88 73   Resp: 12 15 17    Temp:    98.5 F (36.9 C)  TempSrc:    Oral  SpO2: 100% 99% 100%   Weight: 182 lb 1.6 oz (82.6 kg)     Height:        Intake/Output Summary (Last 24 hours) at 05/29/16 0900 Last data filed at 05/29/16 0800  Gross per 24 hour  Intake           979.27 ml  Output             1200 ml  Net          -220.73 ml   Filed Weights   05/28/16 0740 05/28/16 0747 05/29/16 0500  Weight: 189 lb 9.5 oz (86 kg) 182 lb 15.7 oz (83 kg) 182 lb 1.6 oz (82.6 kg)    Physical Exam    GEN: Well nourished, well developed, in mild distress.  HEENT: normal.  Neck: Supple, no JVD, carotid bruits, or masses. Cardiac: RRR, Normal S1&S2no murmurs, rubs, or gallops. No clubbing, cyanosis, edema.  Radials/DP/PT 2+ and equal bilaterally.  Respiratory:  Respirations regular and unlabored, clear to auscultation bilaterally. GI: Soft, nontender, nondistended, BS + x 4. MS: no deformity or  atrophy. Skin: warm and dry, no rash. Neuro:  Strength and sensation are intact. Psych: Normal affect.  Labs    CBC  Recent Labs  05/28/16 0610  05/28/16 0946 05/29/16 0241  WBC 11.2*  --  12.5* 8.4  NEUTROABS 7.1  --  10.8*  --   HGB 12.2*  < > 11.9* 11.0*  HCT 38.3*  < > 38.1* 35.6*  MCV 73.4*  --  73.8* 73.9*  PLT 165  --  145* 149*  < > = values in this interval not displayed. Basic Metabolic Panel  Recent Labs  05/28/16 0946 05/29/16 0241  NA 138 139  K 3.9 3.9  CL 105 105  CO2 24 27  GLUCOSE 101* 102*  BUN 13 10  CREATININE 1.20 0.99  CALCIUM 8.8* 8.7*   Liver Function Tests  Recent Labs  05/28/16 0610 05/28/16 0946  AST 19 231*  ALT 20 40  ALKPHOS 56 53  BILITOT 0.6 0.5  PROT 6.4* 6.3*  ALBUMIN 3.8 3.7   No results for input(s): LIPASE, AMYLASE in the last 72 hours. Cardiac Enzymes  Recent  Labs  05/28/16 0946 05/28/16 1515 05/28/16 1950  TROPONINI 57.41* >65.00* >65.00*   BNP Invalid input(s): POCBNP Harvey-Dimer No results for input(s): DDIMER in the last 72 hours. Hemoglobin A1C  Recent Labs  05/28/16 0946  HGBA1C 5.9*   Fasting Lipid Panel  Recent Labs  05/29/16 0241  CHOL 156  HDL 28*  LDLCALC 95  TRIG 161*  CHOLHDL 5.6   Thyroid Function Tests  Recent Labs  05/28/16 0946  TSH 0.813    Telemetry    NSR 70s  ECG    (stable) NSR, 76. TWI in II, III, aVF with minimal ST depression in V5 & V6 - ? Evolutionary changes of Inferior STEMI  Cadiology   Cardiac CATH - PCI 1. Severe 3 vessel obstructive CAD    - 80% stenosis of the second diagonal    - 99% stenosis of a small sub-branch of the ramus intermediate    - 100% occlusion of the mid LCx. This is the culprit lesion    - 99% mid RCA 2. Normal LV function with moderately elevated LVEDP - 27 mmHg (in setting of Cardiogenic Shock) 3. Moderate MR 4. Successful stenting of the mid LCx with DES - Promus 2.75 x 16 (3.0) 5. Successful stenting of the mid RCA with  DES - Promus 2.25 x 16 (2.5)  Plan: transfer to ICU. Continue IV levophed for BP support. Check Echo today to assess MR - I suspect papillary muscle ischemia may have contributed to presentation with shock. DAPT for one year.            Radiology    No results found.  Patient Summary     45 y/o ? with a h/o HTN, Tob abuse, ETOH abuse, and pancreatitis.  For the past 4 months, he has been imprisoned @ the [facility].  He was in his usoh until early this AM, when he developed c/p and diaphoresis.  EMS was called and ECG showed ST elevation.  Code STEMI was activated and he was taken emergently to [facility] ER  cath lab. 100% occluded large, co-dominant Cx & 99% mid RCA complicated by Cardiogenic Shock requiring Levophed. DES PCI of both Cx & RCA due to shock.  Assessment & Plan    Principal Problem:   Acute ST elevation myocardial infarction (STEMI) involving left circumflex coronary artery (HCC) --> CAD S/P PCI to coDom Cx & mRCA with DES complicated by Cardiogenic shock (HCC)  Weaned off Levophed yesterday &  Able to start low dose BB   MR noted on LV Gram, suspect Papillary muscle dysfunction - check 2dEcho to assess  On ASA, Brilinta & high dose Atorvastatin (LDL 95)    Essential hypertension - on HCTZ @ home (cough with ACE-I) -- low dose BB started yesterday.  EF preserved on LV Gram - will hold off on ARB for now.    Tobacco use - cessation counseling  OK to transfer to Tele today.   Ambulate   If stable, anticipate Harvey/c tomorrow pending Echo findings.  Signed, [name], MD

## 2016-05-29 NOTE — Progress Notes (Signed)
  Echocardiogram 2D Echocardiogram has been performed.  Janalyn HarderWest, Bryannah Boston R 05/29/2016, 12:02 PM

## 2016-05-29 NOTE — Progress Notes (Signed)
CARDIAC REHAB PHASE I   PRE:  Rate/Rhythm: 80 SR  BP:  Supine:   Sitting: 130/81  Standing:    SaO2: 100%RA  MODE:  Ambulation: 520 ft   POST:  Rate/Rhythm: 103 ST  BP:  Supine:   Sitting: 147/92  Standing:    SaO2: 100%RA 0955-1047 Pt walked 520 ft on RA with steady gait. No CP. Tolerated well. To chair and later to brush teeth. MI ed completed with pt who voiced understanding. Stressed importance of brilinta with stent. Will need to see case manager to make sure available where he is going after discharge. Reviewed NTG use, diet , ex ed and MI restrictions. Will not refer to CRP 2 as incarcerated. Has not smoked for 4 months and plans to not smoke.    Luetta Nuttingharlene Hester Joslin, RN BSN  05/29/2016 10:44 AM

## 2016-05-29 NOTE — Progress Notes (Signed)
Patient sitting up in bed, guard present at bedside. No needs at this time. Call light within reach

## 2016-05-29 NOTE — Progress Notes (Signed)
Spoke w pt and guard. [facility] and [facility]ilinta in [facility]. Pt has 30day free and pt assist form. Pt has no ins when no in jail.

## 2016-05-30 ENCOUNTER — Encounter (HOSPITAL_COMMUNITY): Payer: Self-pay | Admitting: Nurse Practitioner

## 2016-05-30 DIAGNOSIS — Z8719 Personal history of other diseases of the digestive system: Secondary | ICD-10-CM

## 2016-05-30 DIAGNOSIS — E785 Hyperlipidemia, unspecified: Secondary | ICD-10-CM

## 2016-05-30 DIAGNOSIS — I119 Hypertensive heart disease without heart failure: Secondary | ICD-10-CM | POA: Diagnosis present

## 2016-05-30 DIAGNOSIS — Z720 Tobacco use: Secondary | ICD-10-CM | POA: Diagnosis present

## 2016-05-30 DIAGNOSIS — I5189 Other ill-defined heart diseases: Secondary | ICD-10-CM

## 2016-05-30 DIAGNOSIS — F1010 Alcohol abuse, uncomplicated: Secondary | ICD-10-CM

## 2016-05-30 DIAGNOSIS — I25119 Atherosclerotic heart disease of native coronary artery with unspecified angina pectoris: Secondary | ICD-10-CM | POA: Diagnosis present

## 2016-05-30 DIAGNOSIS — I519 Heart disease, unspecified: Secondary | ICD-10-CM

## 2016-05-30 DIAGNOSIS — I25110 Atherosclerotic heart disease of native coronary artery with unstable angina pectoris: Secondary | ICD-10-CM

## 2016-05-30 DIAGNOSIS — E876 Hypokalemia: Secondary | ICD-10-CM

## 2016-05-30 DIAGNOSIS — I2510 Atherosclerotic heart disease of native coronary artery without angina pectoris: Secondary | ICD-10-CM

## 2016-05-30 MED ORDER — METOPROLOL TARTRATE 25 MG PO TABS: 12.5000 mg | ORAL_TABLET | Freq: Two times a day (BID) | ORAL | 6 refills | Status: DC

## 2016-05-30 MED ORDER — TICAGRELOR 90 MG PO TABS: 90.0000 mg | ORAL_TABLET | Freq: Two times a day (BID) | ORAL | 6 refills | Status: DC

## 2016-05-30 MED ORDER — ATORVASTATIN CALCIUM 80 MG PO TABS: 80.0000 mg | ORAL_TABLET | Freq: Every day | ORAL | 6 refills | Status: DC

## 2016-05-30 MED ORDER — NITROGLYCERIN 0.4 MG SL SUBL: 0.4000 mg | SUBLINGUAL_TABLET | SUBLINGUAL | 3 refills | Status: AC | PRN

## 2016-05-30 MED ORDER — ASPIRIN 81 MG PO TBEC: 81.0000 mg | DELAYED_RELEASE_TABLET | Freq: Every day | ORAL | 6 refills | Status: DC

## 2016-05-30 MED FILL — Heparin Sodium (Porcine) 2 Unit/ML in Sodium Chloride 0.9%: INTRAMUSCULAR | Qty: 500 | Status: AC

## 2016-05-30 MED FILL — Nitroglycerin IV Soln 100 MCG/ML in D5W: INTRA_ARTERIAL | Qty: 10 | Status: AC

## 2016-05-30 NOTE — Progress Notes (Signed)
CARDIAC REHAB PHASE I   PRE:  Rate/Rhythm: 76 SR  BP:  Supine:   Sitting: 150/97  Standing:    SaO2: 100%RA  MODE:  Ambulation: 700 ft   POST:  Rate/Rhythm: 98 SR  BP:  Supine:   Sitting: 168/99  Standing:    SaO2: 100%RA 1010-1040 Pt walked 700 ft with steady gait. Denied CP but did c/o headache and had some blurry vision last night watching TV. BP at 168/99 after walk. To chair with call bell. Notified RN of BP and c/o headache. Has read over information given yesterday and no questions.   Luetta Nuttingharlene Caliyah Sieh, RN BSN  05/30/2016 10:35 AM

## 2016-05-30 NOTE — Progress Notes (Signed)
Spoke with the physician on call. Patient will need to go back through the Emergency room in order to be readmitted. Discharge instructions given. Night medications given. [facility] [employer] [facility].

## 2016-05-30 NOTE — Progress Notes (Addendum)
[facility].  Dr states Marden NobleBrilinta is not in their formulary. I gave him my number for call back to confirm that arrangements had already been made per CM not on 05/29/16.  Will continue to monitor. Thomas HoffBurton, Alicianna Litchford McClintock, RN   4:10pm MD called to state that medication was available.

## 2016-05-30 NOTE — Discharge Summary (Signed)
Discharge Summary    Patient ID: Brian Harvey,  MRN: [id number], DOB/AGE: [birth year] 45 y.o.  Admit date: 05/28/2016 Discharge date: 05/30/2016  Primary Care Provider: Pcp Not In System Primary Cardiologist: P. Swaziland, MD   Discharge Diagnoses    Principal Problem:   Acute ST elevation myocardial infarction (STEMI) involving left circumflex coronary artery (HCC)  **s/p PCI/DES of the LCX and RCA.  Active Problems:   Cardiogenic shock (HCC)   CAD   Tobacco use   Hyperlipidemia   ETOH abuse   Hypokalemia   Hypertensive heart disease   Tobacco abuse   History of pancreatitis (in setting of alcoholism)   Diastolic dysfunction  Allergies Allergies  Allergen Reactions  . Ace Inhibitors Cough    Diagnostic Studies/Procedures    Cardiac Catheterization and Percutaneous Coronary Intervention 9.6.2017  Coronary Findings  Dominance: Co-dominant  Left Anterior Descending  Prox LAD lesion, 40% stenosed.  Second Diagonal [employer] 2nd Diag lesion, 80% stenosed   Medically managed.  Ramus Intermedius  Vessel is small.  Lateral Ramus Intermedius  Vessel is small in size.  Lat Ramus lesion, 99% stenosed.  Medically managed.  Left Circumflex  Prox Cx lesion, 100% stenosed.  Angioplasty: The proximal LCX was successfully stented using a 3.75 x 16 mm Promus Premier DES.  With reperfusion the lesion was noted to be at the bifurcation of a large OM. Attempt was made to Lainez a second wire into the OM to protect it but the wire could not be passed due to acute angle takeoff of the vessel. The LCx was stented across the OM and post dilated with a 3.0 mm balloon with excellent result. There was moderate narrowing of the ostium of the OM but it maintained TIMI 3 flow.  There is no residual stenosis post intervention.  Right Coronary Artery  Ost RCA to Prox RCA lesion, 30% stenosed.  Mid RCA lesion, 99% stenosed.  Angioplasty: The mid RCA was successfully stented using a 2.5 x  16 mm Promus Premier DES.  There is no residual stenosis post intervention.   Left Ventricle The left ventricular size is normal. The left ventricular systolic function is normal. LV end diastolic pressure is moderately elevated. The left ventricular ejection fraction is 55-65% by visual estimate. No regional wall motion abnormalities. There is moderate (3+) mitral regurgitation.  Diagnostic Diagram     Post-Intervention Diagram     _____________  2D Echocardiogram 9.7.2017  Study Conclusions   - Left ventricle: The cavity size was normal. Septal wall thickness   was increased in a pattern of moderate LVH with severe   hypertrophy of the posterior wall. Systolic function was normal.   The estimated ejection fraction was in the range of 55% to 60%.   Wall motion was normal; there were no regional wall motion   abnormalities. Doppler parameters are consistent with abnormal   left ventricular relaxation (grade 1 diastolic dysfunction).   Doppler parameters are consistent with high ventricular filling   pressure. - Aortic valve: Transvalvular velocity was within the normal range.   There was no stenosis. There was no regurgitation. - Mitral valve: Transvalvular velocity was within the normal range.   There was no evidence for stenosis. There was mild regurgitation   directed posteriorly. - Right ventricle: The cavity size was normal. Wall thickness was   normal. Systolic function was normal. - Atrial septum: No defect or patent foramen ovale was identified   by color flow Doppler. -  Tricuspid valve: There was no regurgitation. _____________    History of Present Illness     45 y/o ? with a history of hypertension, tobacco abuse, alcohol abuse, and pancreatitis. For the past 4 months, [facility] He was in his usual state of health until early on the morning of September 6, when he developed chest pain and diaphoresis. EMS was called to the  prison and he was noted to have significant inferior ST segment elevation. A code STEMI was activated and he was [facility] room for further evaluation.  Hospital Course     Consultants: None  Following arrival to the emergency department, patient continued to have chest pain with ST segment elevation. He was taken emergently to the cardiac catheterization laboratory, where diagnostic catheterization revealed a total occlusion of the proximal left circumflex as well as a 99% stenosis in the mid right coronary artery. He had small vessel diagonal and ramus intermedius disease as well. LV function was normal though 3+ mitral regurgitation was suspected. The left circumflex was felt to be the infarct vessel and this was successfully treated using a 3.75 x 16 mm Promus Premier drug-eluting stent. Patient was hypotensive during the procedure and given evidence of cardiogenic shock, attention was then turned to the right coronary artery, which was successfully stented using a 2.5 x 16 mm Promus Premier drug-eluting stent. Patient required vasopressor therapy and was subsequently transferred to the coronary intensive care unit, where he ruled in for myocardial infarction, peaking his troponin at greater than 65. Blood pressure stabilized post PCI in we were able to wean him off of Levophed therapy on September 6. 2-D echocardiogram was performed on September 7 and showed normal LV function with grade 1 diastolic dysfunction, and only mild mitral regurgitation. As pressure improved, we were able to add low-dose beta blocker on top of aspirin, brilinta, and high potency statin therapy.  Brian Harvey was transferred out to the floor on September 7 and has been evaluated by cardiac rehabilitation. He has been ambulatory without recurrent symptoms or limitations. He is felt to be stable discharge back to prison today. He will require close monitoring care at least until his follow-up appointment on  September 19. Provided that he is stable at that time, he could likely be transferred back [facility]. _____________  Physical Exam   Discharge Vitals Blood pressure 125/74, pulse 74, temperature 97.9 F (36.6 C), temperature source Oral, resp. rate 18, height 5\' 5"  (1.651 m), weight 187 lb 14.4 oz (85.2 kg), SpO2 100 %.  Filed Weights   05/28/16 0747 05/29/16 0500 05/30/16 0533  Weight: 182 lb 15.7 oz (83 kg) 182 lb 1.6 oz (82.6 kg) 187 lb 14.4 oz (85.2 kg)     GEN: Well nourished, well developed, in no acute distress.  HEENT: Grossly normal.  Neck: Supple, no JVD, carotid bruits, or masses. Cardiac: RRR, no murmurs, rubs, or gallops. No clubbing, cyanosis, edema.  Radials/DP/PT 2+ and equal bilaterally.  Respiratory:  Respirations regular and unlabored, clear to auscultation bilaterally. GI: Soft, nontender, nondistended, BS + x 4. MS: no deformity or atrophy. Skin: warm and dry, no rash. Neuro:  Strength and sensation are intact. Psych: AAOx3.  Normal affect.  Labs & Radiologic Studies    CBC  Recent Labs  05/28/16 0610  05/28/16 0946 05/29/16 0241  WBC 11.2*  --  12.5* 8.4  NEUTROABS 7.1  --  10.8*  --   HGB 12.2*  < > 11.9* 11.0*  HCT 38.3*  < > 38.1* 35.6*  MCV 73.4*  --  73.8* 73.9*  PLT 165  --  145* 149*  < > = values in this interval not displayed. Basic Metabolic Panel  Recent Labs  05/28/16 0946 05/29/16 0241  NA 138 139  K 3.9 3.9  CL 105 105  CO2 24 27  GLUCOSE 101* 102*  BUN 13 10  CREATININE 1.20 0.99  CALCIUM 8.8* 8.7*   Liver Function Tests  Recent Labs  05/28/16 0610 05/28/16 0946  AST 19 231*  ALT 20 40  ALKPHOS 56 53  BILITOT 0.6 0.5  PROT 6.4* 6.3*  ALBUMIN 3.8 3.7   Cardiac Enzymes  Recent Labs  05/28/16 1515 05/28/16 1950 05/29/16 0842  TROPONINI >65.00* >65.00* >65.00*   Hemoglobin A1C  Recent Labs  05/28/16 0946  HGBA1C 5.9*   Fasting Lipid Panel  Recent Labs   05/29/16 0241  CHOL 156  HDL 28*  LDLCALC 95  TRIG 409*  CHOLHDL 5.6   Thyroid Function Tests  Recent Labs  05/28/16 0946  TSH 0.813   _____________  No results found. Disposition   Pt is being discharged home today in good condition.  Follow-up Plans & Appointments    Follow-up Information    Nicolasa Ducking, NP Follow up on 06/10/2016.   Specialties:  Nurse Practitioner, Cardiology, Radiology Why:  11:30 AM - Dr. Elissa Hefty NP Contact information: [address] [place] [address] [phone]           Discharge Medications   Current Discharge Medication List    START taking these medications   Details  aspirin EC 81 MG EC tablet Take 1 tablet (81 mg total) by mouth daily. Qty: 30 tablet, Refills: 6    atorvastatin (LIPITOR) 80 MG tablet Take 1 tablet (80 mg total) by mouth daily at 6 PM. Qty: 30 tablet, Refills: 6    metoprolol tartrate (LOPRESSOR) 25 MG tablet Take 0.5 tablets (12.5 mg total) by mouth 2 (two) times daily. Qty: 30 tablet, Refills: 6    nitroGLYCERIN (NITROSTAT) 0.4 MG SL tablet Place 1 tablet (0.4 mg total) under the tongue every 5 (five) minutes x 3 doses as needed for chest pain. Qty: 25 tablet, Refills: 3    ticagrelor (BRILINTA) 90 MG TABS tablet Take 1 tablet (90 mg total) by mouth 2 (two) times daily. Qty: 60 tablet, Refills: 6      STOP taking these medications     hydrochlorothiazide (HYDRODIURIL) 25 MG tablet      lisinopril (PRINIVIL,ZESTRIL) 40 MG tablet      Aspirin-Salicylamide-Caffeine (BC HEADACHE) 325-95-16 MG TABS      HYDROcodone-acetaminophen (NORCO) 5-325 MG per tablet      ranitidine (ZANTAC) 150 MG capsule          Aspirin prescribed at discharge?  Yes High Intensity Statin Prescribed? (Lipitor 40-80mg  or Crestor 20-40mg ): Yes Beta Blocker Prescribed? Yes For EF <40%, was ACEI/ARB Prescribed? No: N/A ADP Receptor Inhibitor Prescribed? (i.e. Plavix etc.-Includes Medically Managed  Patients): Yes For EF <40%, Aldosterone Inhibitor Prescribed? No: N/A Was EF assessed during THIS hospitalization? Yes Was Cardiac Rehab II ordered? (Included Medically managed Patients): Yes   Outstanding Labs/Studies   Follow up lipids/lft's in 6 wks.  Duration of Discharge Encounter   Greater than 30 minutes including physician time.  Signed, Nicolasa Ducking NP 05/30/2016, 11:54 AM  I have seen, examined and  evaluated the patient this AM along with Mr. Brion Aliment, NP-C.  After reviewing all the available data and chart, we discussed the patients laboratory, study & physical findings as well as symptoms in detail. I agree with his findings, examination as well as impression recommendations as per our discussion.  He is ready for discharge.  Status post 2 vessel PCI in the setting of an acute inferior STEMI with cardiac shock minutes with the resolved within a few hours of PCI. 2 vessel PCI was done because of the shock. He also has subtotal occlusion of a branch of one of his ramus branches as well as severe ostial lesion of the very small diagonal branch. Both these vessels are probably too small for PCI would probably be best suited with medical management. If he has symptoms in follow-up, consider ischemic evaluation with a non-invasive study.  He is on aspirin, Brilinta as well as high-dose statin. Is able to tolerate low-dose beta blocker, but we've not restarted HCTZ. EF preserved with no MR which was a concern based on his LV gram.  Plan is discharged today with close follow-up with Mr. Brion Aliment followed by long-term follow-up in the [place] office.    [name], M.D., M.S. Interventional Cardiologist   Pager # [phone] Phone # [phone] [address] [place], [place] [address]

## 2016-05-30 NOTE — Progress Notes (Signed)
Patient and guard stated that patient would be transported when night shift guard came in between 18:45 -19:15. At shift change patient stated that he did not want to leave because he did not feel right.  Patient had previously expressed concern because he did not feel he would get his medication that he needed once at the facility.  I explained that because he was leaving after shift change we would give him his medication before he left.   19:43 paged PA on call to make aware that patient did not want to leave.  I explained to the patient that I would let the MD know but he had no IV access and no telemetry.  Patient did not complain of "feelilng bad" until change of shift. Night shift CN aware of situation. Pt resting with call bell within reach, watching ball game.

## 2016-05-30 NOTE — Discharge Instructions (Signed)
**  PLEASE REMEMBER TO BRING ALL OF YOUR MEDICATIONS TO EACH OF YOUR FOLLOW-UP OFFICE VISITS. ° °NO HEAVY LIFTING X 4 WEEKS. °NO SEXUAL ACTIVITY X 4 WEEKS. °NO DRIVING X 2 WEEKS. °NO SOAKING BATHS, HOT TUBS, POOLS, ETC., X 7 DAYS. ° °Radial Site Care °Refer to this sheet in the next few weeks. These instructions provide you with information on caring for yourself after your procedure. Your caregiver may also give you more specific instructions. Your treatment has been planned according to current medical practices, but problems sometimes occur. Call your caregiver if you have any problems or questions after your procedure. °HOME CARE INSTRUCTIONS °· You may shower the day after the procedure. Remove the bandage (dressing) and gently wash the site with plain soap and water. Gently pat the site dry.  °· Do not apply powder or lotion to the site.  °· Do not submerge the affected site in water for 3 to 5 days.  °· Inspect the site at least twice daily.  °· Do not flex or bend the affected arm for 24 hours.  °· No lifting over 5 pounds (2.3 kg) for 5 days after your procedure.  °· Do not drive home if you are discharged the same day of the procedure. Have someone else drive you.  °· You may drive 24 hours after the procedure unless otherwise instructed by your caregiver.  °What to expect: °· Any bruising will usually fade within 1 to 2 weeks.  °· Blood that collects in the tissue (hematoma) may be painful to the touch. It should usually decrease in size and tenderness within 1 to 2 weeks.  °SEEK IMMEDIATE MEDICAL CARE IF: °· You have unusual pain at the radial site.  °· You have redness, warmth, swelling, or pain at the radial site.  °· You have drainage (other than a small amount of blood on the dressing).  °· You have chills.  °· You have a fever or persistent symptoms for more than 72 hours.  °· You have a fever and your symptoms suddenly get worse.  °· Your arm becomes pale, cool, tingly, or numb.  °· You have heavy  bleeding from the site. Hold pressure on the site.  °_____________ °  °  ° °10 Habits of Highly Healthy People ° °[place] wants to help you get well and stay well.  Live a longer, healthier life by practicing healthy habits every day. ° °1.  Visit your primary care provider regularly. °2.  Make time for family and friends.  Healthy relationships are important. °3.  Take medications as directed by your provider. °4.  Maintain a healthy weight and a trim waistline. °5.  Eat healthy meals and snacks, rich in fruits, vegetables, whole grains, and lean proteins. °6.  Get moving every day - aim for 150 minutes of moderate physical activity each week. °7.  Don't smoke. °8.  Avoid alcohol or drink in moderation. °9.  Manage stress through meditation or mindful relaxation. °10.  Get seven to nine hours of quality sleep each night. ° °Want more information on healthy habits?  To learn more about these and other healthy habits, visit [email or web address]. °_____________ °  °  °

## 2016-06-10 ENCOUNTER — Encounter: Payer: Self-pay | Admitting: Nurse Practitioner

## 2016-06-10 ENCOUNTER — Ambulatory Visit (INDEPENDENT_AMBULATORY_CARE_PROVIDER_SITE_OTHER): Admitting: Nurse Practitioner

## 2016-06-10 VITALS — BP 161/101 | HR 71 | Ht 65.0 in | Wt 178.4 lb

## 2016-06-10 DIAGNOSIS — I2510 Atherosclerotic heart disease of native coronary artery without angina pectoris: Secondary | ICD-10-CM

## 2016-06-10 DIAGNOSIS — I2119 ST elevation (STEMI) myocardial infarction involving other coronary artery of inferior wall: Secondary | ICD-10-CM | POA: Diagnosis not present

## 2016-06-10 DIAGNOSIS — E785 Hyperlipidemia, unspecified: Secondary | ICD-10-CM

## 2016-06-10 DIAGNOSIS — I119 Hypertensive heart disease without heart failure: Secondary | ICD-10-CM | POA: Diagnosis not present

## 2016-06-10 DIAGNOSIS — Z720 Tobacco use: Secondary | ICD-10-CM

## 2016-06-10 NOTE — Patient Instructions (Signed)
Follow-Up:  Your physician recommends that you schedule a follow-up appointment in: 2 months in [place].

## 2016-06-10 NOTE — Progress Notes (Signed)
Office Visit    Patient Name: Brian Harvey Date of Encounter: 06/10/2016  Primary Care Provider:  Pcp Not In System Primary Cardiologist:  Pt will f/u in [place] office  Chief Complaint    45 year old male status post recent admission for inferior ST elevation MI who presents for follow-up.   Past Medical History    Past Medical History:  Diagnosis Date  . Alcohol abuse   . CAD (coronary artery disease)    a. 05/2016 Acute inferior STEMI/PCI: LM nl, LAD 40p, D2 80ost/prox (med Rx), RI small, 99 in lat branch, LCX 100 (3.75x16 Promus Premier DES), RCA 30ost/prox, 54m (2.5x16 Promus Premier DES), EF 55-65%.  . Diastolic dysfunction    a. 05/2016 Echo: EF 55-60%, no rwma, Gr1 DD, mild MR.  Marland Kitchen History of pancreatitis (in setting of alcoholism)   . Hypertensive heart disease   . Tobacco abuse    Past Surgical History:  Procedure Laterality Date  . CARDIAC CATHETERIZATION N/A 05/28/2016   Procedure: Left Heart Cath and Coronary Angiography;  Surgeon: Peter M Swaziland, MD;  Location: [facility] INVASIVE CV LAB;  Service: Cardiovascular;  Laterality: N/A;  . CARDIAC CATHETERIZATION N/A 05/28/2016   Procedure: Coronary Stent Intervention;  Surgeon: Peter M Swaziland, MD;  Location: [facility] INVASIVE CV LAB;  Service: Cardiovascular;  Laterality: N/A;    Allergies  Allergies  Allergen Reactions  . Ace Inhibitors Cough    History of Present Illness    45 year old male with a history of hypertension, tobacco abuse, and alcohol abuse. He was admitted to [facility] on September 6 in the setting of an acute inferior ST segment elevation myocardial infarction. Catheterization revealed a total occlusion of the left circumflex as well as a subtotal occlusion of the mid right coronary artery. He had small vessel diagonal and ramus intermedius disease as well. The circumflex and the right coronary artery were successfully stented using Promus Premier drug-eluting stents. He did have evidence of cardiogenic  shock and hypotension and did require vasopressor therapy during the procedure and for some time afterwards. Follow-up echo showed normal LV function with grade 1 diastolic dysfunction and mild mitral regurgitation. Pressures were relatively soft and he was subsequently discharged back to prison on aspirin, statin, and beta blocker. As he would require a higher level of care related to his recent event, he has been staying at [facility] in [place]. He has not been having any chest pain or dyspnea and is eager to get back to the [facility] as soon as possible. He reports compliance with medications but is concerned that he has not been getting the right medications. Review of list [facility] from [facility] today shows that he has not been receiving atorvastatin. They have resumed his hydrochlorothiazide, which we discontinued during his hospitalization. It appears that this is only ordered as a when necessary medications currently. His metoprolol, which had been prescribed at 12.5 mg twice a day as it is a short acting medication, has been provided as 25 mg once daily. He has been getting aspirin and Brilinta as prescribed. He is concerned that there is too much salt on his food at the prison and would like a heart healthy diet.   Home Medications    Prior to Admission medications   Medication Sig Start Date End Date Taking? Authorizing Provider  aspirin EC 81 MG EC tablet Take 1 tablet (81 mg total) by mouth daily. 05/30/16   Ok Anis, NP  atorvastatin (LIPITOR) 80  MG tablet Take 1 tablet (80 mg total) by mouth daily at 6 PM. 05/30/16   Ok Anishristopher R Novaleigh Koh[facility]man, NP  metoprolol tartrate (LOPRESSOR) 25 MG tablet Take 0.5 tablets (12.5 mg total) by mouth 2 (two) times daily. 05/30/16   Ok Anishristopher R Ezinne Yogi, NP  nitroGLYCERIN (NITROSTAT) 0.4 MG SL tablet Place 1 tablet (0.4 mg total) under the tongue every 5 (five) minutes x 3 doses as needed for chest pain. 05/30/16   Ok Anishristopher R Jerrion Tabbert, NP    ticagrelor (BRILINTA) 90 MG TABS tablet Take 1 tablet (90 mg total) by mouth 2 (two) times daily. 05/30/16   Ok Anishristopher R Tequan Redmon, NP    Review of Systems    As above, he has been doing well without chest pain or dyspnea. There is no history of PND, orthopnea, dizziness, syncope, edema, or early satiety.  All other systems reviewed and are otherwise negative except as noted above.  Physical Exam    VS:  BP (!) 161/101   Pulse 71   Ht 5\' 5"  (1.651 m)   Wt 178 lb 6.4 oz (80.9 kg)   BMI 29.69 kg/m  , BMI Body mass index is 29.69 kg/m. Repeat blood pressure 151/100. GEN: Well nourished, well developed, in no acute distress.  HEENT: normal.  Neck: Supple, no JVD, carotid bruits, or masses. Cardiac: RRR, no murmurs, rubs, or gallops. No clubbing, cyanosis, edema.  Radials/DP/PT 2+ and equal bilaterally. Right radial catheterization site is without bleeding, bruit, or hematoma. Respiratory:  Respirations regular and unlabored, clear to auscultation bilaterally. GI: Soft, nontender, nondistended, BS + x 4. MS: no deformity or atrophy. Skin: warm and dry, no rash. Neuro:  Strength and sensation are intact. Psych: Normal affect.  Accessory Clinical Findings    ECG - Regular sinus rhythm, 71, inferior infarct with ST segment elevation in leads 1, 2, aVL, and V2 through V5. T-wave inversion in II, III, aVF and V6. This is similar to September 8 ECG, though ST elevation is slightly more pronounced. He is a symptomatic.  Assessment & Plan    1.  Inferior ST segment elevation myocardial infarction, subsequent episode of care/coronary artery disease: Patient was recently admitted with chest pain and inferior ST segment elevation. Catheterization revealed an occluded left circumflex and severe disease in the native right coronary artery. Both areas were successfully stented. He does have residual small vessel diagonal and ramus intermedius disease, which is being medically managed. He has not been  having any chest pain or dyspnea. He is not currently a candidate for cardiac rehabilitation, as he is imprisoned. [facility] because it was felt he would need a higher level of healthcare for some period of time following his discharge. As he has been doing so well, he is able to move back to a more normal level of care and thus can likely be [facility] if this is deemed to be appropriate by the medical and enforcement staff at [facility].  He shall continue on aspirin 81 mg daily, Brilinta 90 mg twice a day, and I would like him to increase his metoprolol to 25 mg twice a day as his blood pressure is elevated today. Further, he should be on Lipitor 80 mg every afternoon. This was listed on his discharge summary, however it does not appear that he has been receiving this at [facility]. Further, he is concerned about the sodium in his diet. He should be on a heart healthy diet  including low fat, low cholesterol, and 2 g sodium.  2. Hypertensive heart disease: Blood pressure is elevated today. He says it's been running in the 120s. He thinks it may be elevated secondary to being anxious. I have recommended that we increase his metoprolol to 25 mg twice a day.  3. Hyperlipidemia: LDL was 95 and September 7. He should be on atorvastatin 80 mg daily. It does not appear that he has been receiving this. This should be initiated.    4. Tobacco and alcohol abuse: Complete cessation advised.  5. Disposition: Follow-up in our [place] office in approximately 2 months. We can check lipids and LFTs at that point.  Nicolasa Ducking, NP 06/10/2016, 12:03 PM

## 2016-08-12 ENCOUNTER — Encounter: Payer: Self-pay | Admitting: Nurse Practitioner

## 2016-08-12 ENCOUNTER — Ambulatory Visit (INDEPENDENT_AMBULATORY_CARE_PROVIDER_SITE_OTHER): Admitting: Nurse Practitioner

## 2016-08-12 VITALS — BP 166/120 | HR 68 | Ht 64.0 in | Wt 184.4 lb

## 2016-08-12 DIAGNOSIS — I2510 Atherosclerotic heart disease of native coronary artery without angina pectoris: Secondary | ICD-10-CM

## 2016-08-12 DIAGNOSIS — I119 Hypertensive heart disease without heart failure: Secondary | ICD-10-CM | POA: Diagnosis not present

## 2016-08-12 DIAGNOSIS — E782 Mixed hyperlipidemia: Secondary | ICD-10-CM | POA: Diagnosis not present

## 2016-08-12 MED ORDER — AMLODIPINE BESYLATE 5 MG PO TABS: 5.0000 mg | ORAL_TABLET | Freq: Every day | ORAL | 3 refills | Status: DC

## 2016-08-12 NOTE — Progress Notes (Signed)
Office Visit    Patient Name: Brian Harvey Date of Encounter: 08/12/2016  Primary Care Provider:  Pcp Not In System Primary Cardiologist:  Ranae Palms. Harding, MD   Chief Complaint    45 year old male status post recent admission for inferior ST elevation MI who presents for follow-up.   Past Medical History    Past Medical History:  Diagnosis Date  . Alcohol abuse   . CAD (coronary artery disease)    a. 05/2016 Acute inferior STEMI/PCI: LM nl, LAD 40p, D2 80ost/prox (med Rx), RI small, 99 in lat branch, LCX 100 (3.75x16 Promus Premier DES), RCA 30ost/prox, 3448m (2.5x16 Promus Premier DES), EF 55-65%.  . Diastolic dysfunction    a. 05/2016 Echo: EF 55-60%, no rwma, Gr1 DD, mild MR.  Marland Kitchen. History of pancreatitis (in setting of alcoholism)   . Hypertensive heart disease   . Tobacco abuse    Past Surgical History:  Procedure Laterality Date  . CARDIAC CATHETERIZATION N/A 05/28/2016   Procedure: Left Heart Cath and Coronary Angiography;  Surgeon: Peter M SwazilandJordan, MD;  [facility] INVASIVE CV LAB;  Service: Cardiovascular;  Laterality: N/A;  . CARDIAC CATHETERIZATION N/A 05/28/2016   Procedure: Coronary Stent Intervention;  Surgeon: Peter M SwazilandJordan, MD;  [facility];  Service: Cardiovascular;  Laterality: N/A;    Allergies  Allergies  Allergen Reactions  . Ace Inhibitors Cough    History of Present Illness    45 year old male with a history of hypertension, tobacco abuse, and alcohol abuse. He was admitted [facility] on September 6 in the setting of an acute inferior ST segment elevation myocardial infarction. Catheterization revealed a total occlusion of the left circumflex as well as a subtotal occlusion of the mid right coronary artery. He had small vessel diagonal and ramus intermedius disease as well. The circumflex and the right coronary artery were successfully stented using Promus Premier drug-eluting stents. He did have evidence of cardiogenic shock and  hypotension and did require vasopressor therapy during the procedure and for some time afterwards. Follow-up echo showed normal LV function with grade 1 diastolic dysfunction and mild mitral regurgitation. Pressures were relatively soft and he was subsequently discharged back to prison on aspirin, statin, and beta blocker. As he would require a higher level of care related to his recent event, he had been staying at [facility] in [place] but since his last visit with me in Sept, he has been staying @ a prison in [place] and has been doing well.  He denies chest pain, palpitations, dyspnea, pnd, orthopnea, n, v, dizziness, syncope, edema, weight gain, or early satiety. He has been tolerating his meds well.  He does not routinely have his bp checked.  He has been having some tooth pain and apparently needs a tooth pulled.  He is aware that he may not come off of brilinta @ this time.  Home Medications    Prior to Admission medications   Medication Sig Start Date End Date Taking? Authorizing Provider  aspirin EC 81 MG EC tablet Take 1 tablet (81 mg total) by mouth daily. 05/30/16  Yes Ok Anishristopher R Michaeljames Milnes, NP  atorvastatin (LIPITOR) 80 MG tablet Take 1 tablet (80 mg total) by mouth daily at 6 PM. 05/30/16  Yes Ok Anishristopher R Gunda Maqueda, NP  hydrochlorothiazide (HYDRODIURIL) 25 MG tablet Take 25 mg by mouth daily.   Yes Historical Provider, MD  metoprolol tartrate (LOPRESSOR) 25 MG tablet Take 25 mg by mouth 2 (two) times daily.  Yes Historical Provider, MD  nitroGLYCERIN (NITROSTAT) 0.4 MG SL tablet Place 1 tablet (0.4 mg total) under the tongue every 5 (five) minutes x 3 doses as needed for chest pain. 05/30/16  Yes Ok Anishristopher R Oluwatosin Bracy, NP  ticagrelor (BRILINTA) 90 MG TABS tablet Take 1 tablet (90 mg total) by mouth 2 (two) times daily. 05/30/16  Yes Ok Anishristopher R Jaina Morin, NP  amLODipine (NORVASC) 5 MG tablet Take 1 tablet (5 mg total) by mouth daily. 08/12/16 11/10/16  Ok Anishristopher R Micai Apolinar, NP    Review of Systems     He denies chest pain, palpitations, dyspnea, pnd, orthopnea, n, v, dizziness, syncope, edema, weight gain, or early satiety.  All other systems reviewed and are otherwise negative except as noted above.  Physical Exam    VS:  BP (!) 166/120   Pulse 68   Ht 5\' 4"  (1.626 m)   Wt 184 lb 6.4 oz (83.6 kg)   BMI 31.65 kg/m  , BMI Body mass index is 31.65 kg/m. GEN: Well nourished, well developed, in no acute distress.  HEENT: normal.  Neck: Supple, no JVD, carotid bruits, or masses. Cardiac: RRR, no murmurs, rubs, or gallops. No clubbing, cyanosis, edema.  Radials/DP/PT 2+ and equal bilaterally.  Respiratory:  Respirations regular and unlabored, clear to auscultation bilaterally. GI: Soft, nontender, nondistended, BS + x 4. MS: no deformity or atrophy. Skin: warm and dry, no rash. Neuro:  Strength and sensation are intact. Psych: Normal affect.  Accessory Clinical Findings    He will need f/u lipids and lft's, which can be drawn @ the prison.  Assessment & Plan    1.  CAD:  S/p inferior STEMI in September 2017.  Catheterization revealed an occluded left circumflex and severe disease in the native right coronary artery. Both areas were successfully stented. He does have residual small vessel diagonal and ramus intermedius disease, which is being medically managed. He has not been having any chest pain or dyspnea.  He remains in prison and is thus not enrolled in cardiac rehab, though he has been exercising some.  He remains on ASA, brilinta, metoprolol, and high potency statin therapy.  He may not come off of brilinta at this time for elective tooth extraction.  2.  Hypertensive Heart Disease:  BP markedly elevated today - 190/110 on repeat.  He has already taken his AM meds.  Cont  blocker and HCTZ and I will add amlodipine 5 mg daily.  He will need close BP monitoring in prison with titration/adjustment of antihypertensive therapy as needed.  This can be accomplished by primary care @  the prison.  3. HL:  He has been taking lipitor and will need f/u lipids and lft's.  It would likely be easiest if this was drawn in the prison on a day that he is fasting.  4. Tobacco and etoh abuse:  Not currently smoking or drinking.  5.  Disposition:  F/u fasting lipids/lft's - this can be drawn @ the prison as we are not able to easily arrange for draw today.  F/u with Ranae Palms. Harding, MD in 6 mos or sooner if necessary.  Will need primary care f/u for BP mgmt with at least weekly BP checks performed by prison medical staff.   Nicolasa Duckinghristopher Jessee Newnam, NP 08/12/2016, 10:53 AM

## 2016-08-12 NOTE — Patient Instructions (Addendum)
Medication Instructions:  START- Amlodipine 5 mg daily  Labwork: None Ordered  Testing/Procedures: None Ordered  Follow-Up: Your physician recommends that you schedule a follow-up appointment in: 6 Months with Dr Herbie BaltimoreHarding 30 mins   Any Other Special Instructions Will Be Listed Below (If Applicable).     If you need a refill on your cardiac medications before your next appointment, please call your pharmacy.

## 2017-02-09 ENCOUNTER — Ambulatory Visit (INDEPENDENT_AMBULATORY_CARE_PROVIDER_SITE_OTHER): Admitting: Cardiology

## 2017-02-09 ENCOUNTER — Encounter: Payer: Self-pay | Admitting: Cardiology

## 2017-02-09 VITALS — BP 151/98 | HR 72 | Ht 65.0 in | Wt 182.6 lb

## 2017-02-09 DIAGNOSIS — I2121 ST elevation (STEMI) myocardial infarction involving left circumflex coronary artery: Secondary | ICD-10-CM

## 2017-02-09 DIAGNOSIS — I10 Essential (primary) hypertension: Secondary | ICD-10-CM | POA: Diagnosis not present

## 2017-02-09 DIAGNOSIS — I519 Heart disease, unspecified: Secondary | ICD-10-CM | POA: Diagnosis not present

## 2017-02-09 DIAGNOSIS — E785 Hyperlipidemia, unspecified: Secondary | ICD-10-CM

## 2017-02-09 DIAGNOSIS — I5189 Other ill-defined heart diseases: Secondary | ICD-10-CM

## 2017-02-09 DIAGNOSIS — I2510 Atherosclerotic heart disease of native coronary artery without angina pectoris: Secondary | ICD-10-CM | POA: Diagnosis not present

## 2017-02-09 DIAGNOSIS — I110 Hypertensive heart disease with heart failure: Secondary | ICD-10-CM | POA: Diagnosis not present

## 2017-02-09 DIAGNOSIS — I5032 Chronic diastolic (congestive) heart failure: Secondary | ICD-10-CM

## 2017-02-09 DIAGNOSIS — Z9861 Coronary angioplasty status: Secondary | ICD-10-CM

## 2017-02-09 MED ORDER — ISOSORBIDE MONONITRATE ER 30 MG PO TB24: 30.0000 mg | ORAL_TABLET | Freq: Every day | ORAL | 6 refills | Status: DC

## 2017-02-09 MED ORDER — HYDROCHLOROTHIAZIDE 25 MG PO TABS: 25.0000 mg | ORAL_TABLET | Freq: Every day | ORAL | 11 refills | Status: DC

## 2017-02-09 NOTE — Progress Notes (Signed)
PCP: System, Pcp Not In  Clinic Note: Chief Complaint  Patient presents with  . Follow-up    CAD-PCI  . Chest Pain    pt states a little     HPI: Brian Harvey is a 46 y.o. male (nmate at [facility] prison [place])with a PMH below who presents today for six-month follow-up of CAD-inferiorSTEMI-PCI. - inferior STEMI 05/28/2016 with total occlusion of the circumflex and subtotal occluded mid RCA. --> 2 vessel PCI with Promus DES stents.  Was competent by cardiac shock requiring vasopressors. - Follow-up echo showed preserved EF grade 1 diastolic dysfunction and mild MR. - Discharge back to prison with aspirin, Brilinta,statin and beta blocker.  Brian Harvey was last seen on 08/12/2016 by Ward Givens, NP - doing relatively well. Was noted to be on beta blocker and HCTZ and amlodipine was added.  Was noted to be no longer smoking. - Lipids be monitored by prison infirmary  Recent Hospitalizations: none  Studies Personally Reviewed - (if available, images/films reviewed: From Epic Chart or Care Everywhere)  Cardiac cath and PCI 05/28/2016:  1. Severe 3 vessel obstructive CAD    - 80% stenosis of the Diag2    - 99% stenosis of a small sub-branch of the Ramus Intermediate    - 100% occlusion of the mid LCx. This is the Culprit Lesion #1  --> Successful stenting of the mid LCx with DES (PROMUS PREM MR 2.75X16 --> 3.0 mm)    - 99% mid RCA - Lesion #2- Successful stenting of the mid RCA with DES (STENT PROMUS PREM MR 2.25X16 - 2.5 mm) 2. Normal LV function with moderately elevated LVEDP. No RWMA 3. Moderate MR Diagnostic Diagram         Post-Intervention Diagram                                 2-D echo 05/29/2016: EF 55-60%. Moderate-severe LVH with severe posterior wall hypertrophy. GR 1 DD with high filling pressures. Mild MR  Interval History: Brian Harvey presents here today to follow-up with me, probably because I was the discharging physician. The initial plan was for him to  follow-up and [place] but this did not happen. There is probably some confusion when he transferred from [facility] to [facility]. Also was not clear is what medications he is truly taking. He did not bring a list with him and therefore is not sure what medicines he is currently taking he thinks he is taking 6 medicines. Is not sure if he is taking HCTZ or amlodipine. He tells me that for the most part he had intermittent episodes of chest discomfort off and on, but usually that'll last very long and are not necessarily associated with anything significant. He maybe has some dizziness on occasion when he stands up quickly but otherwise no syncope/r near syncope. He has been in restricted access confinement stating that he really does not have much time to be out and about walking and doing exercises. He's not currently physically exercising, but does enjoy going for walks when he is able to. Besides some intermittent chest discomfort which seems to be relatively lateral in the chest and more associated with certain movements, he denies any anginal type pain consistent with his MI -- however, this discomfort is occasionally relieved with nitroglycerin raising the possibility of anginal symptoms. No rapid irregular heartbeats or palpitations. No TIA/amaurosis fugax.  No PND, orthopnea or  edema. No melena, hematochezia, hematuria, or epstaxis. No claudication.  ROS: A comprehensive was performed. Review of Systems  Constitutional: Negative for malaise/fatigue.  Respiratory: Negative for cough, shortness of breath and wheezing.   Cardiovascular:       Per history of present illness  Musculoskeletal: Negative for joint pain and myalgias.  Neurological: Positive for dizziness (Per history of present illness). Negative for focal weakness.  Endo/Heme/Allergies: Bruises/bleeds easily.  Psychiatric/Behavioral: Negative.   All other systems reviewed and are negative.   I have reviewed and  (if needed) personally updated the patient's problem list, medications, allergies, past medical and surgical history, social and family history.   Past Medical History:  Diagnosis Date  . Alcohol abuse   . CAD S/P percutaneous coronary angioplasty 05/2016   a. 05/2016 Acute inferior STEMI/PCI: LM nl, pLAD 40%, o-p D2 80% (med Rx), small RI- lat branch 99% (med Rx).  Culprit: mLCX 100%-> (2.75x16 Promus Premier DES - 3.0), o-p RCA 30%, mRCA 99% -> (2.5x16 Promus Premier DES - 2.5), EF 55-65%, Mod MR, High LVEDP  . Diastolic dysfunction    a. 05/2016 Echo: EF 55-60%, no rwma, Gr1 DD, mild MR.  Marland Kitchen History of pancreatitis (in setting of alcoholism)   . Hypertensive heart disease   . ST elevation myocardial infarction (STEMI) of inferolateral wall (HCC) 05/2016   Complicated by Cardiogenic Shock --> 100-% Cx & 99% RCA --> 2V PCI  . Tobacco abuse     Past Surgical History:  Procedure Laterality Date  . CARDIAC CATHETERIZATION N/A 05/28/2016   Procedure: Left Heart Cath and Coronary Angiography;  Surgeon: Peter M Swaziland, MD;  Location: [facility] INVASIVE CV LAB;  Service: Cardiovascular:  LM nl, pLAD 40%, o-p D2 80% (med Rx), small RI- lat branch 99% (med Rx).  Culprit: mLCX 100%-> PCI. O-p RCA 30%, mRCA 99% -> PCI. EF 55-65%, Mod MR, High LVEDP  . CARDIAC CATHETERIZATION N/A 05/28/2016   Procedure: Coronary Stent Intervention;  Surgeon: Peter M Swaziland, MD;  Location: [facility] INVASIVE CV LAB;  Service: Cardiovascular: Culprit: mLCX 100%-> (2.75x16 Promus Premier DES - 3.0); mRCA 99% -> (2.5x16 Promus Premier DES - 2.5)  . TRANSTHORACIC ECHOCARDIOGRAM  05/29/2016   EF 55-60%. Moderate-severe LVH with severe posterior wall hypertrophy. GR 1 DD with high filling pressures. Mild MR    Current Meds  Medication Sig  . aspirin EC 81 MG EC tablet Take 1 tablet (81 mg total) by mouth daily.  Marland Kitchen atorvastatin (LIPITOR) 80 MG tablet Take 1 tablet (80 mg total) by mouth daily at 6 PM.  . hydrochlorothiazide (HYDRODIURIL) 25  MG tablet Take 1 tablet (25 mg total) by mouth daily.  . metoprolol tartrate (LOPRESSOR) 25 MG tablet Take 25 mg by mouth 2 (two) times daily.  . nitroGLYCERIN (NITROSTAT) 0.4 MG SL tablet Place 1 tablet (0.4 mg total) under the tongue every 5 (five) minutes x 3 doses as needed for chest pain.  . ticagrelor (BRILINTA) 90 MG TABS tablet Take 1 tablet (90 mg total) by mouth 2 (two) times daily.  . [DISCONTINUED] hydrochlorothiazide (HYDRODIURIL) 25 MG tablet Take 25 mg by mouth daily.  - Not really sure of these meds - he thinks he is taking Amlodipine BID & NOT HCTZ.  Allergies  Allergen Reactions  . Ace Inhibitors Cough    Social History   Social History  . Marital status: Single    Spouse name: N/A  . Number of children: N/A  . Years of education: N/A   Social  History Main Topics  . Smoking status: Former Smoker    Packs/day: 1.50    Years: 25.00    Types: Cigarettes    Quit date: 01/26/2016  . Smokeless tobacco: Never Used     Comment: in prison x 4 months, currently not smoking.  . Alcohol use 8.4 oz/week    14 Cans of beer per week     Comment: in prison x 4 months, currently not drinking.  . Drug use: No  . Sexual activity: Not Asked   Other Topics Concern  . None   Social History Narrative   Currently an Inmate @ [employer] (02/09/2017) -> expected Parole date this summer (2018).   When not in prison - lives in [place].  -- Currently an inmate at [facility]. Initially following his MI, he was at [employer]. Apparently he is up for parole this summer.  family history includes Diabetes in his father.  Wt Readings from Last 3 Encounters:  02/09/17 182 lb 9.6 oz (82.8 kg)  08/12/16 184 lb 6.4 oz (83.6 kg)  06/10/16 178 lb 6.4 oz (80.9 kg)    PHYSICAL EXAM BP (!) 151/98   Pulse 72   Ht 5\' 5"  (1.651 m)   Wt 182 lb 9.6 oz (82.8 kg)   SpO2 100%   BMI 30.39 kg/m  General appearance: alert, cooperative, appears stated age, no distress.  Healthy-appearing. Well-nourished and well-groomed. - In handcuffs HEENT: Stout/AT, EOMI, MMM, anicteric sclera Neck: no adenopathy, no carotid bruit and no JVD Lungs: clear to auscultation bilaterally, normal percussion bilaterally and non-labored Heart: regular rate and rhythm, S1 &S2 normal, no murmur, click, rub or gallop; nondisplaced PMI  Abdomen: soft, non-tender; bowel sounds normal; no masses,  no organomegaly; no HJR  Extremities: extremities normal, atraumatic, no cyanosoredema  Pulses: 2+ and symmetric;  Skin: mobility and turgor normal, no edema, no evidence of bleeding or bruising and no lesions noted Neurologic: Mental status: Alert & oriented x 3, thought content appropriate; non-focal exam.  Pleasant mood & affect.   Adult ECG Report n/a  Other studies Reviewed: Additional studies/ records that were reviewed today include:  Recent Labs:  Not available    ASSESSMENT / PLAN: Problem List Items Addressed This Visit    CAD S/P PCI to coDom Cx & mRCA with DES - Primary (Chronic)    He did have other small vessel disease besides the vessels treated with PCI. Continued medical management of those. Otherwise he remains on aspirin plus Brilinta. I suspect that he may have some issues obtaining Brilinta once he released from prison. I informed him of the importance of taking Brilinta, and not missing dose. If he is having issues obtaining it once he gets out, he will need to contact the office for samples and potentially source of Plavix. I don't perceive stopping his antiplatelet agent based on the extent of disease elsewhere.  Continue statin and beta blocker. With his heart rate of 72, we can probably increase the Lopressor and future, but for now we need to ensure that he is taking amlodipine.  - Because he is having some intermittent episodes of chest discomfort for which she has been taking nitroglycerin with some relief, we will start low-dose Imdur.      Relevant Medications     hydrochlorothiazide (HYDRODIURIL) 25 MG tablet   isosorbide mononitrate (IMDUR) 30 MG 24 hr tablet   Diastolic dysfunction    Related to hypertensive heart disease. Currently euvolemic. Need to work  on more adequately controlled blood pressure. He should be back on HCTZ for mild diuretic effect      Essential hypertension (Chronic)   Relevant Medications   hydrochlorothiazide (HYDRODIURIL) 25 MG tablet   isosorbide mononitrate (IMDUR) 30 MG 24 hr tablet   Other Relevant Orders   Comprehensive metabolic panel   Hyperlipidemia with target LDL less than 70 (Chronic)    On high-dose atorvastatin. I have not seen labs a done recently. Plan: Recheck labs in June at the prison infirmary --> I would ask that they please send a copy of his labs and his current med list      Relevant Medications   hydrochlorothiazide (HYDRODIURIL) 25 MG tablet   isosorbide mononitrate (IMDUR) 30 MG 24 hr tablet   Other Relevant Orders   Lipid panel   Comprehensive metabolic panel   Hypertensive heart disease (Chronic)    Blood pressure now not well controlled. I'm not sure he is taking. We need to ensure that he is taking amlodipine and HCTZ. If he has, then the plan would be to further titrate. He tells me that he is taking another medicine twice a day which may be the amlodipine. - I am concerned that he may be having some hypertensive heart disease related diastolic dysfunction angina. - Adding Imdur and working on blood pressure management  If so, plan would then be to increase to 50 twice a day metoprolol.      Relevant Medications   hydrochlorothiazide (HYDRODIURIL) 25 MG tablet   isosorbide mononitrate (IMDUR) 30 MG 24 hr tablet   Other Relevant Orders   Comprehensive metabolic panel   ST elevation myocardial infarction (STEMI) involving left circumflex coronary artery in recovery phase (HCC) (Chronic)    Preserved EF by echo. Recovered from his cardiac shock, now with hypertension. No heart  failure symptoms or recurrent anginal symptoms.      Relevant Medications   hydrochlorothiazide (HYDRODIURIL) 25 MG tablet   isosorbide mononitrate (IMDUR) 30 MG 24 hr tablet      Current medicines are reviewed at length with the patient today. (+/- concerns) Not sure of current med list - did not have list The following changes have been made: ==> need to know med list.  Patient Instructions  MEDICATION CHANGES   RESTART HCTZ 25 MG ONE TABLET  DAILY  START ISOSORBIDE MONONITRATE 30 MG TAKE AT BEDTIME    IN 2 WEEKS  Monday  February 23 2017 - LABS  FASTING NOTHING TO EAT OR DRINK PRIOR TO LAB WORK CMP  LIPIDS. SEND COPY TO OFFICE -- FAX [phone]   PLEASE TAKE BLOOD PRESSURE AT LEAST ONCE A WEEK AND BRING TO NEXT OFFICE VISIT.   Your physician wants you to follow-up in 6 MONTHS WITH C. BERGE PA.You will receive a reminder letter in the mail two months in advance. If you don't receive a letter, please call our office to schedule the follow-up appointment.    Studies Ordered:   Orders Placed This Encounter  Procedures  . Lipid panel  . Comprehensive metabolic panel      Bryan Lemmaavid Andry Bogden, M.D., M.S. Interventional Cardiologist   Pager # [phone] Phone # [phone] [address] [place], [place] [address]

## 2017-02-09 NOTE — Patient Instructions (Addendum)
MEDICATION CHANGES   RESTART HCTZ 25 MG ONE TABLET  DAILY  START ISOSORBIDE MONONITRATE 30 MG TAKE AT BEDTIME    IN 2 WEEKS  Monday  February 23 2017 - LABS  FASTING NOTHING TO EAT OR DRINK PRIOR TO LAB WORK CMP  LIPIDS. SEND COPY TO OFFICE -- FAX [phone]   PLEASE TAKE BLOOD PRESSURE AT LEAST ONCE A WEEK AND BRING TO NEXT OFFICE VISIT.   Your physician wants you to follow-up in 6 MONTHS WITH C. BERGE PA.You will receive a reminder letter in the mail two months in advance. If you don't receive a letter, please call our office to schedule the follow-up appointment.

## 2017-02-10 ENCOUNTER — Encounter: Payer: Self-pay | Admitting: Cardiology

## 2017-02-10 NOTE — Assessment & Plan Note (Signed)
On high-dose atorvastatin. I have not seen labs a done recently. Plan: Recheck labs in June at the prison infirmary --> I would ask that they please send a copy of his labs and his current med list

## 2017-02-10 NOTE — Assessment & Plan Note (Signed)
Preserved EF by echo. Recovered from his cardiac shock, now with hypertension. No heart failure symptoms or recurrent anginal symptoms.

## 2017-02-10 NOTE — Assessment & Plan Note (Addendum)
Blood pressure now not well controlled. I'm not sure he is taking. We need to ensure that he is taking amlodipine and HCTZ. If he has, then the plan would be to further titrate. He tells me that he is taking another medicine twice a day which may be the amlodipine. - I am concerned that he may be having some hypertensive heart disease related diastolic dysfunction angina. - Adding Imdur and working on blood pressure management  If so, plan would then be to increase to 50 twice a day metoprolol.

## 2017-02-10 NOTE — Assessment & Plan Note (Signed)
Related to hypertensive heart disease. Currently euvolemic. Need to work on more adequately controlled blood pressure. He should be back on HCTZ for mild diuretic effect

## 2017-02-10 NOTE — Assessment & Plan Note (Addendum)
He did have other small vessel disease besides the vessels treated with PCI. Continued medical management of those. Otherwise he remains on aspirin plus Brilinta. I suspect that he may have some issues obtaining Brilinta once he released from prison. I informed him of the importance of taking Brilinta, and not missing dose. If he is having issues obtaining it once he gets out, he will need to contact the office for samples and potentially source of Plavix. I don't perceive stopping his antiplatelet agent based on the extent of disease elsewhere.  Continue statin and beta blocker. With his heart rate of 72, we can probably increase the Lopressor and future, but for now we need to ensure that he is taking amlodipine.  - Because he is having some intermittent episodes of chest discomfort for which she has been taking nitroglycerin with some relief, we will start low-dose Imdur.

## 2017-02-11 ENCOUNTER — Telehealth: Payer: Self-pay | Admitting: Cardiology

## 2017-02-11 NOTE — Telephone Encounter (Signed)
New message     Having reaction to medication  He was put on

## 2017-02-11 NOTE — Telephone Encounter (Signed)
Returned call to [facility]. Medical dept staff has already spoken with Dr. Herbie BaltimoreHarding.

## 2017-07-29 ENCOUNTER — Ambulatory Visit (INDEPENDENT_AMBULATORY_CARE_PROVIDER_SITE_OTHER): Admitting: Cardiology

## 2017-07-29 ENCOUNTER — Telehealth: Payer: Self-pay | Admitting: *Deleted

## 2017-07-29 ENCOUNTER — Encounter: Payer: Self-pay | Admitting: Cardiology

## 2017-07-29 VITALS — BP 142/94 | HR 67 | Ht 65.0 in | Wt 186.4 lb

## 2017-07-29 DIAGNOSIS — I110 Hypertensive heart disease with heart failure: Secondary | ICD-10-CM

## 2017-07-29 DIAGNOSIS — I5030 Unspecified diastolic (congestive) heart failure: Secondary | ICD-10-CM

## 2017-07-29 DIAGNOSIS — I2510 Atherosclerotic heart disease of native coronary artery without angina pectoris: Secondary | ICD-10-CM

## 2017-07-29 DIAGNOSIS — E785 Hyperlipidemia, unspecified: Secondary | ICD-10-CM

## 2017-07-29 DIAGNOSIS — I2121 ST elevation (STEMI) myocardial infarction involving left circumflex coronary artery: Secondary | ICD-10-CM | POA: Diagnosis not present

## 2017-07-29 DIAGNOSIS — Z9861 Coronary angioplasty status: Secondary | ICD-10-CM | POA: Diagnosis not present

## 2017-07-29 MED ORDER — METOPROLOL TARTRATE 50 MG PO TABS: 50.0000 mg | ORAL_TABLET | Freq: Two times a day (BID) | ORAL | 3 refills | Status: DC

## 2017-07-29 MED ORDER — TAB-A-VITE/IRON PO TABS: 1.0000 | ORAL_TABLET | Freq: Every day | ORAL | 3 refills | Status: DC

## 2017-07-29 MED ORDER — ACETAMINOPHEN ER 650 MG PO TBCR: 650.0000 mg | EXTENDED_RELEASE_TABLET | Freq: Two times a day (BID) | ORAL | 3 refills | Status: AC | PRN

## 2017-07-29 NOTE — Assessment & Plan Note (Signed)
Especially with small vessel disease, he is susceptible for angina and heart failure from hypertensive heart disease. Plan: Continue current dose of amlodipine and losartan and HCTZ.  Increase metoprolol to 50 mg twice daily.

## 2017-07-29 NOTE — Assessment & Plan Note (Signed)
Remains on high-dose statin.  Unfortunately I do not have a full cholesterol panel on him.  We should recheck them in December timeframe to see if we can potentially reduce his statin dose from 80 mg atorvastatin down to 40.

## 2017-07-29 NOTE — Patient Instructions (Addendum)
MEDICATION CHANGES    ---STOP TAKING ISOSORBIDE MONO 30 MG  -----CONTINUE ISOSORBIDE DINITRATE 15 MG DAILY  ---- DISCONTINUE TAKING METOPROLOL 25 MG  -----INCREASE METOPROLOL TARTRATE 50 MG  ONE TABLET TWICE A DAY.   MAY TAKE  TYLENOL ( GENERIC ) 650 MG  TWICE A DAY  AS NEEDED FOR HEADACHES OR MUSCULAR PAIN.   --- START TAKING MULTIVITAMIN WITH IRON ONE TABLET DAILY.    NO OTHER CHANGES WITH MEDICATIONS  LABS - CMP LIPID IN DEC 2018 , PLEASE HAVE CORRECTIONAL FAC. SEND RESULTS.   Your physician wants you to follow-up in 6 MONTHS( MAY 2019) WITH DR HARDING.You will receive a reminder letter in the mail two months in advance. If you don't receive a letter, please call our office to schedule the follow-up appointment.   If you need a refill on your cardiac medications before your next appointment, please call your pharmacy.

## 2017-07-29 NOTE — Assessment & Plan Note (Signed)
He recovered from cardiogenic shock and was then hypertensive.  Preserved EF by echo with no heart failure symptoms now.  No significant wall motion normality on echo.

## 2017-07-29 NOTE — Progress Notes (Signed)
PCP: System, Pcp Not In  Clinic Note: No chief complaint on file.   HPI: Brian Harvey is a 46 y.o. male with a PMH below who presents today for six-month follow-up for CAD-PCI in the setting of inferior STEMI on May 28, 2016 --September 2017: Inferior STEMI with total occlusion of circumflex and subtotal occlusion mid RCA --two-vessel PCI with Promus DES stents. -->  Initially complicated by cardiac shock requiring vasopressors. He is currently an inmate at [facility] in [facility] --not sure when his sentence is complete.  Brian Harvey was last seen on Feb 09, 2017 -major issue at this time was that we were not sure what medications he is truly taking he was actually doing relatively well but having intermittent chest discomfort so he started Imdur.  Apparently he had some type of reaction with a rash and now that has been switched to Isordil.  I think he is only taking 1/2 tablet at nighttime.  Recent Hospitalizations: None  Studies Personally Reviewed - (if available, images/films reviewed: From Epic Chart or Care Everywhere)  No new studies  Interval History: Brian Harvey returns today overall feeling okay.  He says he has some chest discomfort off and on and may have taken a few nitroglycerin tablets, not a significant amount.  He has some mild muscle aches and pains every now and then and tells me that he he has not had to take anything besides aspirin.  Whenever he takes additional aspirin he feels that he bruises quite easily.  He denies any PND, orthopnea or edema.  No rapid irregular heartbeats or palpitations.  He may get dizzy off and on standing up quickly, but denies any syncope or near syncope, TIA or amaurosis fugax symptoms. No melena, hematochezia, hematuria, or epistaxis.  He really does not do much walking because of his incarceration and therefore it is hard to tell if he has any true exertional chest discomfort or claudication.  ROS: A comprehensive was  performed. Review of Systems  Constitutional: Negative for malaise/fatigue.  HENT: Negative for congestion, nosebleeds and sinus pain.   Respiratory: Negative for cough, shortness of breath and wheezing.   Gastrointestinal: Negative for blood in stool, constipation and heartburn.  Musculoskeletal: Negative for back pain, falls and joint pain (Off and on arthritis pains).  Neurological: Positive for dizziness (Per HPI).  Endo/Heme/Allergies: Negative for environmental allergies.  Psychiatric/Behavioral: Negative for depression. The patient is not nervous/anxious and does not have insomnia.   All other systems reviewed and are negative.  I have reviewed and (if needed) personally updated the patient's problem list, medications, allergies, past medical and surgical history, social and family history.   Past Medical History:  Diagnosis Date  . Alcohol abuse   . CAD S/P percutaneous coronary angioplasty 05/2016   a. 05/2016 Acute inferior STEMI/PCI: LM nl, pLAD 40%, o-p D2 80% (med Rx), small RI- lat branch 99% (med Rx).  Culprit: mLCX 100%-> (2.75x16 Promus Premier DES - 3.0), o-p RCA 30%, mRCA 99% -> (2.5x16 Promus Premier DES - 2.5), EF 55-65%, Mod MR, High LVEDP  . Diastolic dysfunction    a. 05/2016 Echo: EF 55-60%, no rwma, Gr1 DD, mild MR.  Marland Kitchen History of pancreatitis (in setting of alcoholism)   . Hypertensive heart disease   . ST elevation myocardial infarction (STEMI) of inferolateral wall ([place]) 86/7544   Complicated by Cardiogenic Shock --> 100-% Cx & 99% RCA --> 2V PCI  . Tobacco abuse    Past Surgical History:  Procedure Laterality Date  . TRANSTHORACIC ECHOCARDIOGRAM  05/29/2016   EF 55-60%. Moderate-severe LVH with severe posterior wall hypertrophy. GR 1 DD with high filling pressures. Mild MR   Cardiac Cath and PCI 05/28/2016: Severe 3 vessel obstructive CAD  1. 80% stenosis of the Diag2; - 99% stenosis of a small sub-branch of the Ramus Intermediate - Med Rx - 100% occlusion  of the mid LCx. This is the Culprit Lesion #1 --> Successful stenting of the mid LCx with DES (PROMUS PREM MR 2.75X16 --> 3.0 mm)  - 99% mid RCA - Lesion #2- Successful stenting of the mid RCA with DES (STENT PROMUS PREM MR 2.25X16 - 2.5 mm)  2. Normal LV function with moderately elevated LVEDP. No RWMA, Moderate MR Diagnostic Diagram                                                                                      Post-Intervention Diagram                            Prior to Admission medications   Medication Sig Start Date End Date Taking? Authorizing Provider  amLODipine (NORVASC) 10 MG tablet Take 10 mg daily by mouth.   Yes [provider]  aspirin EC 81 MG EC tablet Take 1 tablet (81 mg total) by mouth daily. 05/30/16  Yes Rogelia Mire, NP  atorvastatin (LIPITOR) 80 MG tablet Take 1 tablet (80 mg total) by mouth daily at 6 PM. 05/30/16  Yes Rogelia Mire, NP  hydrochlorothiazide (HYDRODIURIL) 25 MG tablet Take 1 tablet (25 mg total) by mouth daily. 02/09/17  Yes Leonie Man, MD  isosorbide dinitrate (ISORDIL) 30 MG tablet Take 15 mg daily by mouth. Pt takes 15 mg daily   Yes [provider]  losartan (COZAAR) 50 MG tablet Take 50 mg 2 (two) times daily by mouth.   Yes [provider]  nitroGLYCERIN (NITROSTAT) 0.4 MG SL tablet Place 1 tablet (0.4 mg total) under the tongue every 5 (five) minutes x 3 doses as needed for chest pain. 05/30/16  Yes Rogelia Mire, NP  ticagrelor (BRILINTA) 90 MG TABS tablet Take 1 tablet (90 mg total) by mouth 2 (two) times daily. 05/30/16  Yes Rogelia Mire, NP  acetaminophen (TYLENOL) 650 MG CR tablet Take 1 tablet (650 mg total) 2 (two) times daily as needed by mouth for pain (FOR HEADACHES AND MUSCLE PAIN). 07/29/17   Leonie Man, MD  metoprolol tartrate (LOPRESSOR) 50 MG tablet Take 1 tablet (50 mg total) 2 (two) times daily by mouth. 07/29/17 10/27/17  Leonie Man, MD  Multiple Vitamins-Iron  (MULTIVITAMINS WITH IRON) TABS tablet Take 1 tablet daily by mouth. 07/29/17   Leonie Man, MD     Allergies  Allergen Reactions  . Ace Inhibitors Cough    Social History   Socioeconomic History  . Marital status: Single    Spouse name: None  . Number of children: None  . Years of education: None  . Highest education level: None  Social Needs  . Financial resource strain: None  . Food  insecurity - worry: None  . Food insecurity - inability: None  . Transportation needs - medical: None  . Transportation needs - non-medical: None  Occupational History  . None  Tobacco Use  . Smoking status: Former Smoker    Packs/day: 1.50    Years: 25.00    Pack years: 37.50    Types: Cigarettes    Last attempt to quit: 01/26/2016    Years since quitting: 1.5  . Smokeless tobacco: Never Used  . Tobacco comment: in prison x 4 months, currently not smoking.  Substance and Sexual Activity  . Alcohol use: Yes    Alcohol/week: 8.4 oz    Types: 14 Cans of beer per week    Comment: in prison x 4 months, currently not drinking.  . Drug use: No  . Sexual activity: None  Other Topics Concern  . None  Social History Narrative   Currently an Inmate @ [facility] (02/09/2017) -> expected Parole date this summer (2018).   When not in prison - lives in [place].    family history includes Diabetes in his father.  Wt Readings from Last 3 Encounters:  07/29/17 186 lb 6.4 oz (84.6 kg)  02/09/17 182 lb 9.6 oz (82.8 kg)  08/12/16 184 lb 6.4 oz (83.6 kg)    PHYSICAL EXAM BP (!) 142/94   Pulse 67   Ht '5\' 5"'$  (1.651 m)   Wt 186 lb 6.4 oz (84.6 kg)   BMI 31.02 kg/m  Physical Exam  Constitutional: He is oriented to person, place, and time. He appears well-developed and well-nourished. No distress.  Healthy-appearing.  Well-groomed  HENT:  Head: Normocephalic and atraumatic.  Neck: Neck supple. No hepatojugular reflux and no JVD present. Carotid bruit is not present.    Cardiovascular: Normal rate, regular rhythm, normal heart sounds and intact distal pulses.  No extrasystoles are present. PMI is not displaced. Exam reveals no gallop and no friction rub.  No murmur heard. Pulmonary/Chest: Effort normal and breath sounds normal. No respiratory distress. He has no wheezes.  Abdominal: Soft. Bowel sounds are normal. He exhibits no distension. There is no tenderness.  Musculoskeletal: Normal range of motion. He exhibits no edema or deformity.  Neurological: He is alert and oriented to person, place, and time.  Skin: Skin is warm and dry. No erythema.  Psychiatric: He has a normal mood and affect. His behavior is normal. Judgment and thought content normal.  Nursing note and vitals reviewed.   Adult ECG Report  Rate: 67;  Rhythm: normal sinus rhythm and Nonspecific ST-T wave changes.  Normal axis, intervals and durations;   Narrative Interpretation: Relatively normal EKG   Other studies Reviewed: Additional studies/ records that were reviewed today include:  Recent Labs: I did not have these when I first saw him, but scanned labs from May 21, 2017: CBC WBC 6.9, H/H 13.6/41.7, platelets 163.  INR 1.1.  Sodium 140, potassium 3.7, chloride 97, bicarb 22, BUN 14, creatinine 1.25, glucose 93.  Calcium 9.3.  AST 26, ALT (mildly elevated at 88.  Alk phos 79.  Total cholesterol 110 and triglycerides 80.  Remainder labs not available.   ASSESSMENT / PLAN: Problem List Items Addressed This Visit    CAD S/P PCI to coDom Cx & mRCA with DES (Chronic)    He had 2 significant lesions treated with DES stents resulting in reperfusion of the inferior inferolateral wall.  Small diagonal branch and small branch of ramus intermedius being treated medically with significant disease.  This could be why he is having some angina.  He is able to tolerate low-dose Isordil and not Imdur. He is already taking atorvastatin 10 mg along with losartan 50 mg twice daily.  For additional  antianginal benefit, will increase metoprolol dose to 50 mg twice daily.      Relevant Medications   isosorbide dinitrate (ISORDIL) 30 MG tablet   amLODipine (NORVASC) 10 MG tablet   losartan (COZAAR) 50 MG tablet   metoprolol tartrate (LOPRESSOR) 50 MG tablet   Other Relevant Orders   EKG 12-Lead   Lipid panel   Comprehensive metabolic panel   Hyperlipidemia with target LDL less than 70 - Primary (Chronic)    Remains on high-dose statin.  Unfortunately I do not have a full cholesterol panel on him.  We should recheck them in December timeframe to see if we can potentially reduce his statin dose from 80 mg atorvastatin down to 40.      Relevant Medications   isosorbide dinitrate (ISORDIL) 30 MG tablet   amLODipine (NORVASC) 10 MG tablet   losartan (COZAAR) 50 MG tablet   metoprolol tartrate (LOPRESSOR) 50 MG tablet   Other Relevant Orders   EKG 12-Lead   Lipid panel   Comprehensive metabolic panel   Hypertensive heart disease (Chronic)    Especially with small vessel disease, he is susceptible for angina and heart failure from hypertensive heart disease. Plan: Continue current dose of amlodipine and losartan and HCTZ.  Increase metoprolol to 50 mg twice daily.      Relevant Medications   isosorbide dinitrate (ISORDIL) 30 MG tablet   amLODipine (NORVASC) 10 MG tablet   losartan (COZAAR) 50 MG tablet   metoprolol tartrate (LOPRESSOR) 50 MG tablet   ST elevation myocardial infarction (STEMI) involving left circumflex coronary artery in recovery phase (HCC) (Chronic)    He recovered from cardiogenic shock and was then hypertensive.  Preserved EF by echo with no heart failure symptoms now.  No significant wall motion normality on echo.      Relevant Medications   isosorbide dinitrate (ISORDIL) 30 MG tablet   amLODipine (NORVASC) 10 MG tablet   losartan (COZAAR) 50 MG tablet   metoprolol tartrate (LOPRESSOR) 50 MG tablet   Other Relevant Orders   EKG 12-Lead   Lipid panel    Comprehensive metabolic panel     Wanda really does not have a doctor who is totally looking out for him.  He has lots of musculoskeletal aches and pains and I would prefer that he not take NSAIDs while being on aspirin and Brilinta.  My preference would be for him to use Tylenol.  So I will write for 650 mg Tylenol as needed every 8 hours..  We will also provide prescription for multivitamins   Current medicines are reviewed at length with the patient today. (+/- concerns) none The following changes have been made:Increase metoprolol  Patient Instructions  MEDICATION CHANGES    ---STOP TAKING ISOSORBIDE MONO 30 MG  -----CONTINUE ISOSORBIDE DINITRATE 15 MG DAILY  ---- DISCONTINUE TAKING METOPROLOL 25 MG  -----INCREASE METOPROLOL TARTRATE 50 MG  ONE TABLET TWICE A DAY.   MAY TAKE  TYLENOL ( GENERIC ) 650 MG  TWICE A DAY  AS NEEDED FOR HEADACHES OR MUSCULAR PAIN.   --- START TAKING MULTIVITAMIN WITH IRON ONE TABLET DAILY.    NO OTHER CHANGES WITH MEDICATIONS  LABS - CMP LIPID IN DEC 2018 , PLEASE HAVE CORRECTIONAL FAC. SEND RESULTS.   Your physician wants you  to follow-up in 6 MONTHS( MAY 2019) WITH DR HARDING.You will receive a reminder letter in the mail two months in advance. If you don't receive a letter, please call our office to schedule the follow-up appointment.   If you need a refill on your cardiac medications before your next appointment, please call your pharmacy.     Studies Ordered:   Orders Placed This Encounter  Procedures  . Lipid panel  . Comprehensive metabolic panel  . EKG 12-Lead      Glenetta Hew, M.D., M.S. Interventional Cardiologist   Pager # [phone] Phone # [phone] [address] [place], [place] [address]

## 2017-07-29 NOTE — Telephone Encounter (Signed)
NEED CLARIFICATION OF AFTER VISIT SUMMARY  ---- INCREASE START METOPROLOL TARTRATE 50 MG TWICE A DAY  --- CONTINUE TO TAKE LOSARTAN  50 MG TWICE A DAY --- CONTINUE TO TAKE AMLODIPINE 10 MG ONCE A DAY  PER DR HARDING,  CMP, LIPIDS IN DEC 2018 , PLEASE SEND RESULTS  MEDICAL STAFF VERBALIZED UNDERSTANDING.

## 2017-07-29 NOTE — Assessment & Plan Note (Signed)
He had 2 significant lesions treated with DES stents resulting in reperfusion of the inferior inferolateral wall.  Small diagonal branch and small branch of ramus intermedius being treated medically with significant disease.  This could be why he is having some angina.  He is able to tolerate low-dose Isordil and not Imdur. He is already taking atorvastatin 10 mg along with losartan 50 mg twice daily.  For additional antianginal benefit, will increase metoprolol dose to 50 mg twice daily.

## 2018-05-06 ENCOUNTER — Encounter: Payer: Self-pay | Admitting: Cardiology

## 2018-05-28 ENCOUNTER — Encounter: Payer: Self-pay | Admitting: Cardiology

## 2018-07-06 ENCOUNTER — Encounter: Payer: Self-pay | Admitting: Cardiology

## 2018-07-06 ENCOUNTER — Ambulatory Visit (INDEPENDENT_AMBULATORY_CARE_PROVIDER_SITE_OTHER): Admitting: Cardiology

## 2018-07-06 VITALS — BP 148/80 | HR 83 | Ht 66.0 in | Wt 209.0 lb

## 2018-07-06 DIAGNOSIS — M6289 Other specified disorders of muscle: Secondary | ICD-10-CM | POA: Insufficient documentation

## 2018-07-06 DIAGNOSIS — I2510 Atherosclerotic heart disease of native coronary artery without angina pectoris: Secondary | ICD-10-CM

## 2018-07-06 DIAGNOSIS — Z720 Tobacco use: Secondary | ICD-10-CM | POA: Diagnosis not present

## 2018-07-06 DIAGNOSIS — Z9861 Coronary angioplasty status: Secondary | ICD-10-CM

## 2018-07-06 DIAGNOSIS — I10 Essential (primary) hypertension: Secondary | ICD-10-CM

## 2018-07-06 DIAGNOSIS — I110 Hypertensive heart disease with heart failure: Secondary | ICD-10-CM

## 2018-07-06 DIAGNOSIS — E785 Hyperlipidemia, unspecified: Secondary | ICD-10-CM

## 2018-07-06 DIAGNOSIS — I5030 Unspecified diastolic (congestive) heart failure: Secondary | ICD-10-CM

## 2018-07-06 MED ORDER — METOPROLOL TARTRATE 50 MG PO TABS: 50.0000 mg | ORAL_TABLET | Freq: Two times a day (BID) | ORAL | 11 refills | Status: AC

## 2018-07-06 MED ORDER — ATORVASTATIN CALCIUM 40 MG PO TABS: 40.0000 mg | ORAL_TABLET | Freq: Every day | ORAL | 11 refills | Status: DC

## 2018-07-06 MED ORDER — HYDROCHLOROTHIAZIDE 25 MG PO TABS: 25.0000 mg | ORAL_TABLET | Freq: Every day | ORAL | 11 refills | Status: AC

## 2018-07-06 MED ORDER — HYDROCHLOROTHIAZIDE 25 MG PO TABS: 25.0000 mg | ORAL_TABLET | Freq: Every day | ORAL | 11 refills | Status: DC

## 2018-07-06 MED ORDER — TICAGRELOR 60 MG PO TABS: 60.0000 mg | ORAL_TABLET | Freq: Two times a day (BID) | ORAL | 11 refills | Status: DC

## 2018-07-06 NOTE — Patient Instructions (Signed)
Medication Instructions:   CONTINUE CURRENT   MEDICATIONS THAT ARE LISTED ON  MEDICATION SUMMARY RESTART METOPROLOL TARTRATE 50 MG TWICE A DAY DECREASE ATORVASTATIN 40 MG ONE TABLET  DAILY RESTART HYDROCHLOROTHIAZIDE 25 MG ONE TABLET DAILY  WHEN [name] COMPLETE TAKING THE CURRENT BOTTLE OF TICAGRELOR 90 MG TWICE A DAY  ,THEN STOP - AND DECREASE TO TICAGRELOR 60 MG TWICE A DAY  STOP TAKING ASPIRIN  STOP TAKING ISOSORBIDE DN.  If you need a refill on your cardiac medications before your next appointment, please call your pharmacy.   Lab work: NT NEEDED If you have labs (blood work) drawn today and your tests are completely normal, you will receive your results only by: Marland Kitchen MyChart Message (if you have MyChart) OR . A paper copy in the mail If you have any lab test that is abnormal or we need to change your treatment, we will call you to review the results.  Testing/Procedures: SCHEDULE AT [address] Your physician has requested that you have an ankle brachial index (ABI). During this test an ultrasound and blood pressure cuff are used to evaluate the arteries that supply the arms and legs with blood. Allow thirty minutes for this exam. There are no restrictions or special instructions.  AND  Your physician has requested that you have a lower  extremity arterial duplex. This test is an ultrasound of the arteries in the legs. It looks at arterial blood flow in the legs. Allow one hour for Lower and Upper Arterial scans. There are no restrictions or special instructions   Follow-Up: At [facility], you and your health needs are our priority.  As part of our continuing mission to provide you with exceptional heart care, we have created designated Provider Care Teams.  These Care Teams include your primary Cardiologist (physician) and Advanced Practice Providers (APPs -  Physician Assistants and Nurse Practitioners) who all work together to provide you with the care you  need, when you need it. . Your physician recommends that you schedule a follow-up appointment in FEB 2020 WITH DR [facility]. .   Any Other Special Instructions Will Be Listed Below (If Applicable).  NEED A COPY OF LABS  SENT TO  OFFICE -ESPECIALLY CHOLESTEROL LEVEL , CMP  FAX NUMBER [phone]

## 2018-07-06 NOTE — Progress Notes (Signed)
PCP: System, Pcp Not In  Clinic Note: Chief Complaint  Patient presents with  . Follow-up  . Claudication    Complains of bilateral leg pain with walking.  . Coronary Artery Disease    No angina    HPI: Brian Harvey is a 47 y.o. male with a PMH notable for Inf STEMI with 2 V CAD- PCI in 05/2016 who presents today for six-month follow-up. --September 2017: Inferior STEMI with total occlusion of circumflex and subtotal occlusion mid RCA --two-vessel PCI with Promus DES stents. -->  Initially complicated by cardiac shock requiring vasopressors. He is currently an inmate at [employer] in [facility] --not sure when his sentence is complete.  Brian Harvey was last seen in Nov 2018 - doing OK. -Was supposed to be seen in 6 months, but now presents for almost annual follow-up.  Recent Hospitalizations: None  Studies Personally Reviewed - (if available, images/films reviewed: From Epic Chart or Care Everywhere)  No new studies  Interval History: Brian Harvey returns today overall feeling okay from a cardiac standpoint.  He does not really complain of any chest tightness or pressure at rest or with the amount of exertion he does.  He really does not do much walking around now because he says his legs hurt him a lot when he is walking.  Both the hips as well as thighs and calves bother him and they really keeping him from walking.  He says his legs go numb when he walks.  As a result, for the last several months he has been putting on weight because he has not been exercising as much.  He states that he is basically sitting there eating and not exercising to burn calories.  He actually would have freedom to be a walks based on my last recommendations, but has not been able to because of leg pains.  Apparently the prison provider reduce his amlodipine dose to 5 mg because of edema and added Lasix.  But he tells me the Lasix was only given as a 3-week or 3-day supply and is not really been  getting it anymore.  He does not think he is on most of the medicines that are here.  He does not think he is taking HCTZ and he may not be getting the Isordil he other.  He denies any melena, hematochezia or hematuria on Brilinta. No PND orthopnea, and edema is controlled now. No rapid irregular heartbeats palpitations.  No syncope/near syncope or TIA/amaurosis fugax..    ROS: A comprehensive was performed. Review of Systems  Constitutional: Negative for malaise/fatigue and weight loss (Weight gain).  HENT: Negative for congestion, nosebleeds and sinus pain.   Respiratory: Negative for cough, shortness of breath and wheezing.   Cardiovascular: Positive for claudication.  Gastrointestinal: Negative for blood in stool, constipation, heartburn and melena.  Genitourinary: Negative for hematuria.  Musculoskeletal: Positive for myalgias. Negative for back pain, falls and joint pain (Off and on arthritis pains).  Neurological: Positive for dizziness (Not as much since reducing amlodipine dose.).  Endo/Heme/Allergies: Negative for environmental allergies. Does not bruise/bleed easily.  Psychiatric/Behavioral: Negative for depression. The patient is not nervous/anxious and does not have insomnia.   All other systems reviewed and are negative.  I have reviewed and (if needed) personally updated the patient's problem list, medications, allergies, past medical and surgical history, social and family history.   Past Medical History:  Diagnosis Date  . Alcohol abuse   . CAD S/P percutaneous coronary angioplasty  05/2016   a. 05/2016 Acute inferior STEMI/PCI: LM nl, pLAD 40%, o-p D2 80% (med Rx), small RI- lat branch 99% (med Rx).  Culprit: mLCX 100%-> (2.75x16 Promus Premier DES - 3.0), o-p RCA 30%, mRCA 99% -> (2.5x16 Promus Premier DES - 2.5), EF 55-65%, Mod MR, High LVEDP  . Diastolic dysfunction    a. 05/2016 Echo: EF 55-60%, no rwma, Gr1 DD, mild MR.  Marland Kitchen History of pancreatitis (in setting of  alcoholism)   . Hypertensive heart disease   . ST elevation myocardial infarction (STEMI) of inferolateral wall (HCC) 05/2016   Complicated by Cardiogenic Shock --> 100-% Cx & 99% RCA --> 2V PCI  . Tobacco abuse    Past Surgical History:  Procedure Laterality Date  . CARDIAC CATHETERIZATION N/A 05/28/2016   Procedure: Left Heart Cath and Coronary Angiography;  Surgeon: Peter M Swaziland, MD;  Location: [facility] INVASIVE CV LAB;  Service: Cardiovascular:  LM nl, pLAD 40%, o-p D2 80% (med Rx), small RI- lat branch 99% (med Rx).  Culprit: mLCX 100%-> PCI. O-p RCA 30%, mRCA 99% -> PCI. EF 55-65%, Mod MR, High LVEDP  . CARDIAC CATHETERIZATION N/A 05/28/2016   Procedure: Coronary Stent Intervention;  Surgeon: Peter M Swaziland, MD;  Location: [facility] INVASIVE CV LAB;  Service: Cardiovascular: Culprit: mLCX 100%-> (2.75x16 Promus Premier DES - 3.0); mRCA 99% -> (2.5x16 Promus Premier DES - 2.5)  . TRANSTHORACIC ECHOCARDIOGRAM  05/29/2016   EF 55-60%. Moderate-severe LVH with severe posterior wall hypertrophy. GR 1 DD with high filling pressures. Mild MR   Cardiac Cath and PCI 05/28/2016: Severe 3 vessel obstructive CAD: Culprit lesion 100% mid LCx (DES PCI with Promus 2.75 mm 60 mm - 3.0 mm).  Also noted  99% mid RCA (DES PCI with Promus 2.25 mm 60 mm-2.5 mm) -80% D2, 99% branch of RI treated medically.  Moderately elevated LVEDP.  Moderate MR. Diagnostic Diagram                                 Post-Intervention Diagram                    Current Outpatient Medications on File Prior to Visit  Medication Sig Dispense Refill  . amLODipine (NORVASC) 5 MG tablet Take 5 mg by mouth daily.     . furosemide (LASIX) 20 MG tablet Take 20 mg by mouth every Monday, Wednesday, and Friday.    . loratadine (CLARITIN) 10 MG tablet Take 10 mg by mouth daily.    Marland Kitchen losartan (COZAAR) 50 MG tablet Take 50 mg 2 (two) times daily by mouth.    . triamcinolone (NASACORT) 55 MCG/ACT AERO nasal inhaler Place 2 sprays into the nose daily.    Marland Kitchen  acetaminophen (TYLENOL) 650 MG CR tablet Take 1 tablet (650 mg total) 2 (two) times daily as needed by mouth for pain (FOR HEADACHES AND MUSCLE PAIN). (Patient not taking: Reported on 07/06/2018) 180 tablet 3  . Multiple Vitamins-Iron (MULTIVITAMINS WITH IRON) TABS tablet Take 1 tablet daily by mouth. (Patient not taking: Reported on 07/06/2018) 90 tablet 3  . nitroGLYCERIN (NITROSTAT) 0.4 MG SL tablet Place 1 tablet (0.4 mg total) under the tongue every 5 (five) minutes x 3 doses as needed for chest pain. (Patient not taking: Reported on 07/06/2018) 25 tablet 3   No current facility-administered medications on file prior to visit.      Allergies  Allergen Reactions  .  Ace Inhibitors Cough   Social History   Tobacco Use  . Smoking status: Former Smoker    Packs/day: 1.50    Years: 25.00    Pack years: 37.50    Types: Cigarettes    Last attempt to quit: 01/26/2016    Years since quitting: 2.4  . Smokeless tobacco: Never Used  . Tobacco comment: in prison x 4 months, currently not smoking.  Substance Use Topics  . Alcohol use: Yes    Alcohol/week: 14.0 standard drinks    Types: 14 Cans of beer per week    Comment: in prison x 4 months, currently not drinking.  . Drug use: No   Social History   Social History Narrative   Currently still an inmate in [place] present --was hoping to get out summer 2018, but still here with prison guards in [employer]..   Apparently he went to court, and was brought back to prison.   When not in prison - lives in [place].   Family History   family history includes Diabetes in his father.  Wt Readings from Last 3 Encounters:  07/06/18 209 lb (94.8 kg)  07/29/17 186 lb 6.4 oz (84.6 kg)  02/09/17 182 lb 9.6 oz (82.8 kg)    PHYSICAL EXAM BP (!) 148/80   Pulse 83   Ht 5\' 6"  (1.676 m)   Wt 209 lb (94.8 kg)   BMI 33.73 kg/m  Physical Exam  Constitutional: He is oriented to person, place, and time. He appears well-developed and  well-nourished. No distress.  Healthy-appearing.  Well-groomed  HENT:  Head: Normocephalic and atraumatic.  Neck: Neck supple. No hepatojugular reflux and no JVD present. Carotid bruit is not present.  Cardiovascular: Normal rate, regular rhythm, normal heart sounds and intact distal pulses.  No extrasystoles are present. PMI is not displaced. Exam reveals no gallop and no friction rub.  No murmur heard. Pulmonary/Chest: Effort normal and breath sounds normal. No respiratory distress. He has no wheezes.  Abdominal: Soft. Bowel sounds are normal. He exhibits no distension. There is no tenderness.  Musculoskeletal: Normal range of motion. He exhibits no edema or deformity.  Neurological: He is alert and oriented to person, place, and time.  Skin: Skin is warm and dry. No erythema.  Psychiatric: He has a normal mood and affect. His behavior is normal. Judgment and thought content normal.  Nursing note and vitals reviewed.   Adult ECG Report  Rate: 67;  Rhythm: normal sinus rhythm and Nonspecific ST-T wave changes.  Normal axis, intervals and durations;   Narrative Interpretation: Relatively normal EKG   Other studies Reviewed: Additional studies/ records that were reviewed today include:   Recent Labs: Unfortunately, none available.   ASSESSMENT / PLAN: Problem List Items Addressed This Visit    CAD S/P PCI to coDom Cx & mRCA with DES (Chronic)    Pretty significant CAD with two-vessel PCI and 2 small branches that are treated medically.  He is on aspirin and full dose Brilinta.  Far enough out now we can convert to 60 mg Brilinta and stop aspirin.  Plan: Reduce Brilinta to 60 mg daily after current prescription complete Continue statin, restart beta-blocker.  Continue ARB. Not having any more angina, can stop Isordil. Stop aspirin.      Relevant Medications   furosemide (LASIX) 20 MG tablet   metoprolol tartrate (LOPRESSOR) 50 MG tablet   atorvastatin (LIPITOR) 40 MG tablet    hydrochlorothiazide (HYDRODIURIL) 25 MG tablet   Other Relevant  Orders   EKG 12-Lead (Completed)   Essential hypertension - Primary (Chronic)    See hypertensive heart disease. Plan to restart HCTZ and restart metoprolol 50 twice daily as is not currently listed.      Relevant Medications   furosemide (LASIX) 20 MG tablet   metoprolol tartrate (LOPRESSOR) 50 MG tablet   atorvastatin (LIPITOR) 40 MG tablet   hydrochlorothiazide (HYDRODIURIL) 25 MG tablet   Other Relevant Orders   EKG 12-Lead (Completed)   Exercise-induced leg fatigue    Symptoms, concerning for claudication.  At this point I think we will have him come in for lower extremity arterial Dopplers/ABIs to assess for PAD..  If not PAD, may be related to statin and would then try a statin holiday.      Relevant Orders   VAS [place] ABI WITH/WO TBI   VAS [place] LOWER EXTREMITY ARTERIAL DUPLEX   Hyperlipidemia with target LDL less than 70 (Chronic)    Unfortunately, I do not have any recent labs on him.  He is still on 80 mg of Lipitor.  It be nice to see what his labs are. For now would reduce to 40 mg of atorvastatin/Lipitor but will need to have labs checked to ensure that we are still in control.       Relevant Medications   furosemide (LASIX) 20 MG tablet   metoprolol tartrate (LOPRESSOR) 50 MG tablet   atorvastatin (LIPITOR) 40 MG tablet   hydrochlorothiazide (HYDRODIURIL) 25 MG tablet   Hypertensive heart disease (Chronic)    He has microvascular disease and hypertension with diastolic dysfunction.  His blood pressure is not that well-controlled today and not really sure what medicines he taken.  But apparently his amlodipine dose was reduced and he was put on Lasix.  He was supposed to be on HCTZ but he tells me he is not taking it. My recommendation would be to continue metoprolol as current dose but low threshold to convert that to carvedilol.  If he is not taking HCTZ, he should be taking Lasix every day.  -->   Otherwise, I think he should be fine if he is back on HCTZ. He is on 50 twice daily of losartan and doing better.  . I think if he is taking Lasix daily instead of HCTZ, then probably can go back to taking 10 mg of amlodipine.      Relevant Medications   furosemide (LASIX) 20 MG tablet   metoprolol tartrate (LOPRESSOR) 50 MG tablet   atorvastatin (LIPITOR) 40 MG tablet   hydrochlorothiazide (HYDRODIURIL) 25 MG tablet   Tobacco abuse    Still smoking, but really not thinking of quitting.        Gabriell really does not have a doctor who is totally looking out for him.  He has lots of musculoskeletal aches and pains and I would prefer that he not take NSAIDs while being on aspirin and Brilinta.  My preference would be for him to use Tylenol.  So I will write for 650 mg Tylenol as needed every 8 hours..  We will also provide prescription for multivitamins   Current medicines are reviewed at length with the patient today. (+/- concerns) none The following changes have been made:Increase metoprolol  Patient Instructions  Medication Instructions:   CONTINUE CURRENT   MEDICATIONS THAT ARE LISTED ON  MEDICATION SUMMARY RESTART METOPROLOL TARTRATE 50 MG TWICE A DAY DECREASE ATORVASTATIN 40 MG ONE TABLET  DAILY RESTART HYDROCHLOROTHIAZIDE 25 MG ONE TABLET DAILY  WHEN  MR Codispoti COMPLETE TAKING THE CURRENT BOTTLE OF TICAGRELOR 90 MG TWICE A DAY  ,THEN STOP - AND DECREASE TO TICAGRELOR 60 MG TWICE A DAY  STOP TAKING ASPIRIN  STOP TAKING ISOSORBIDE DN.  If you need a refill on your cardiac medications before your next appointment, please call your pharmacy.   Lab work: NT NEEDED If you have labs (blood work) drawn today and your tests are completely normal, you will receive your results only by: Marland Kitchen MyChart Message (if you have MyChart) OR . A paper copy in the mail If you have any lab test that is abnormal or we need to change your treatment, we will call you to review the  results.  Testing/Procedures: SCHEDULE AT [address] Your physician has requested that you have an ankle brachial index (ABI). During this test an ultrasound and blood pressure cuff are used to evaluate the arteries that supply the arms and legs with blood. Allow thirty minutes for this exam. There are no restrictions or special instructions.  AND  Your physician has requested that you have a lower  extremity arterial duplex. This test is an ultrasound of the arteries in the legs. It looks at arterial blood flow in the legs. Allow one hour for Lower and Upper Arterial scans. There are no restrictions or special instructions   Follow-Up: At [facility], you and your health needs are our priority.  As part of our continuing mission to provide you with exceptional heart care, we have created designated Provider Care Teams.  These Care Teams include your primary Cardiologist (physician) and Advanced Practice Providers (APPs -  Physician Assistants and Nurse Practitioners) who all work together to provide you with the care you need, when you need it. . Your physician recommends that you schedule a follow-up appointment in FEB 2020 WITH DR [facility]. .   Any Other Special Instructions Will Be Listed Below (If Applicable).  NEED A COPY OF LABS  SENT TO  OFFICE -ESPECIALLY CHOLESTEROL LEVEL , CMP  FAX NUMBER [phone]     Studies Ordered:   Orders Placed This Encounter  Procedures  . EKG 12-Lead      [name], M.D., M.S. Interventional Cardiologist   Pager # [phone] Phone # [phone] [address] [place], [place] [address]

## 2018-07-08 ENCOUNTER — Encounter: Payer: Self-pay | Admitting: Cardiology

## 2018-07-08 NOTE — Assessment & Plan Note (Signed)
Symptoms, concerning for claudication.  At this point I think we will have him come in for lower extremity arterial Dopplers/ABIs to assess for PAD..  If not PAD, may be related to statin and would then try a statin holiday.

## 2018-07-08 NOTE — Assessment & Plan Note (Signed)
He has microvascular disease and hypertension with diastolic dysfunction.  His blood pressure is not that well-controlled today and not really sure what medicines he taken.  But apparently his amlodipine dose was reduced and he was put on Lasix.  He was supposed to be on HCTZ but he tells me he is not taking it. My recommendation would be to continue metoprolol as current dose but low threshold to convert that to carvedilol.  If he is not taking HCTZ, he should be taking Lasix every day.  -->  Otherwise, I think he should be fine if he is back on HCTZ. He is on 50 twice daily of losartan and doing better.  . I think if he is taking Lasix daily instead of HCTZ, then probably can go back to taking 10 mg of amlodipine.

## 2018-07-08 NOTE — Assessment & Plan Note (Signed)
Still smoking, but really not thinking of quitting.

## 2018-07-08 NOTE — Assessment & Plan Note (Signed)
Unfortunately, I do not have any recent labs on him.  He is still on 80 mg of Lipitor.  It be nice to see what his labs are. For now would reduce to 40 mg of atorvastatin/Lipitor but will need to have labs checked to ensure that we are still in control.

## 2018-07-08 NOTE — Assessment & Plan Note (Signed)
Pretty significant CAD with two-vessel PCI and 2 small branches that are treated medically.  He is on aspirin and full dose Brilinta.  Far enough out now we can convert to 60 mg Brilinta and stop aspirin.  Plan: Reduce Brilinta to 60 mg daily after current prescription complete Continue statin, restart beta-blocker.  Continue ARB. Not having any more angina, can stop Isordil. Stop aspirin.

## 2018-07-08 NOTE — Assessment & Plan Note (Signed)
See hypertensive heart disease. Plan to restart HCTZ and restart metoprolol 50 twice daily as is not currently listed.

## 2018-08-03 ENCOUNTER — Ambulatory Visit (HOSPITAL_COMMUNITY): Admission: RE | Admit: 2018-08-03 | Source: Ambulatory Visit | Attending: Cardiology | Admitting: Cardiology

## 2018-08-10 ENCOUNTER — Encounter (HOSPITAL_COMMUNITY)

## 2018-08-17 ENCOUNTER — Ambulatory Visit (HOSPITAL_COMMUNITY)
Admission: RE | Admit: 2018-08-17 | Discharge: 2018-08-17 | Disposition: A | Discharge status: Home / Self Care | Source: Ambulatory Visit | Attending: Internal Medicine | Admitting: Internal Medicine

## 2018-08-17 ENCOUNTER — Other Ambulatory Visit: Payer: Self-pay | Admitting: Cardiology

## 2018-08-17 DIAGNOSIS — I739 Peripheral vascular disease, unspecified: Secondary | ICD-10-CM

## 2018-08-17 DIAGNOSIS — M6289 Other specified disorders of muscle: Secondary | ICD-10-CM

## 2018-08-26 ENCOUNTER — Telehealth: Payer: Self-pay | Admitting: *Deleted

## 2018-08-26 NOTE — Telephone Encounter (Signed)
CALLED TO FOUND OU  WHERE TO SEND ORDER FOR  START OF MEDICATION A ND TO INFORM PATIENT.  CALLED - TO SPEAK TO SOMEONE IN THE  INFIRMARY--  PLACED ON HOLD FOR 20 MIN.    PATIENT IS  AN INMATE  -  UNABLE TO SEND  INFORMATION.

## 2018-08-26 NOTE — Telephone Encounter (Signed)
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  This could contribute some to having calf pain with ambulation, but is likely not a level would probably do something and the symptoms are significant.  Would recommend starting Pletal 100 mg BID.  Rx: Pletal 100 mg p.o. twice daily.  Dispense 60 tabs.  11 refills.  (Okay to convert to 2855-month supply if requested)  Bryan Lemmaavid Harding, MD

## 2018-08-30 ENCOUNTER — Telehealth: Payer: Self-pay | Admitting: Cardiology

## 2018-08-30 DIAGNOSIS — I739 Peripheral vascular disease, unspecified: Secondary | ICD-10-CM

## 2018-08-30 DIAGNOSIS — M6289 Other specified disorders of muscle: Secondary | ICD-10-CM

## 2018-08-30 NOTE — Telephone Encounter (Signed)
New Message   [facility]aren [facility] calling to obtain a referral to Vein and Vascular as requested by Dr. Herbie BaltimoreHarding. Please call to discuss.

## 2018-08-30 NOTE — Telephone Encounter (Signed)
SEE  TELEPHONE NOTE ON 08/30/18

## 2018-08-30 NOTE — Telephone Encounter (Signed)
SPOKE  TO  [facility] [place]  - JENNY SHOEMAKER She states  - she needs an appointment order for vascular consult per test result notes.  RN informed Boneta LucksJenny will need to speak with Dr Herbie BaltimoreHarding to see if consult is needed--   RN asked Boneta LucksJenny  - how can note of  New medication start be sent to  Facility for patient. Fax number obtained and will be faxed  Aware will contact with instructions   [facility] [place] Fax [phone]  phone [phone]

## 2018-08-31 NOTE — Telephone Encounter (Signed)
CALLED TO  SEE IF REFERRAL HAS BEE COMPLETED FOR PATIENT.  HE STATES -PETAL HAS BEEN REVIEWED BY  DOCTOR/PA AT THE CORRECTIONAL FACILITY.  RN INFORMED PA THAT DR HARDING HAS NOT ADDRESSED QUESTION YET - ( HAS NOT BEEN IN THE OFFICE)

## 2018-08-31 NOTE — Telephone Encounter (Signed)
Notes recorded by Marykay LexHarding, David W, MD on 08/31/2018 at 11:37 AM EST We can have him come to see Dr. Kirke CorinArida for formal consult if Sx are still concerning.   Bryan Lemmaavid Harding, MD  [facility] will try again

## 2018-09-01 NOTE — Telephone Encounter (Signed)
° ° °  [facility] Facility calling with questions regarding patient taking Pletal and Brilinta.  Please call [phone], Brian RuizJohn

## 2018-09-01 NOTE — Telephone Encounter (Signed)
[facility]... He was checking to be sure that the pt was on the right does of Brilinta which I advised him 60mg  bid... I also faxed him the note from the 07/06/18 visit with all of the info re: his med changes.. [phone]. He was also advised that Dr. Herbie BaltimoreHarding had added Pletal to his meds and he was already aware of that change.

## 2018-09-01 NOTE — Telephone Encounter (Signed)
Spoke with the Maxcine HamJenny Shoemaker and advised her that Dr. Herbie BaltimoreHarding is ok with consult with Dr. Kirke CorinArida and she responded that they are a state facility so they appreciate the appt since they are required to do everything they can for their inmates..  Appt made 09/07/18   Auth #[id number]

## 2018-09-01 NOTE — Telephone Encounter (Signed)
Follow up     Boneta LucksJenny is returning call about pt.

## 2018-09-02 MED ORDER — CILOSTAZOL 50 MG PO TABS: 50.0000 mg | ORAL_TABLET | Freq: Two times a day (BID) | ORAL | 11 refills | Status: DC

## 2018-09-02 NOTE — Addendum Note (Signed)
Addended by: Tobin ChadMARTIN, SHARON V on: 09/02/2018 09:55 AM   Modules accepted: Orders

## 2018-09-07 ENCOUNTER — Encounter: Payer: Self-pay | Admitting: Cardiovascular Disease

## 2018-09-07 ENCOUNTER — Ambulatory Visit (INDEPENDENT_AMBULATORY_CARE_PROVIDER_SITE_OTHER): Admitting: Cardiovascular Disease

## 2018-09-07 VITALS — BP 118/82 | HR 88 | Ht 65.0 in | Wt 214.0 lb

## 2018-09-07 DIAGNOSIS — I10 Essential (primary) hypertension: Secondary | ICD-10-CM

## 2018-09-07 DIAGNOSIS — I2510 Atherosclerotic heart disease of native coronary artery without angina pectoris: Secondary | ICD-10-CM | POA: Diagnosis not present

## 2018-09-07 DIAGNOSIS — E785 Hyperlipidemia, unspecified: Secondary | ICD-10-CM | POA: Diagnosis not present

## 2018-09-07 DIAGNOSIS — I739 Peripheral vascular disease, unspecified: Secondary | ICD-10-CM

## 2018-09-07 LAB — BASIC METABOLIC PANEL
BUN/Creatinine Ratio: 17 (ref 9–20)
BUN: 19 mg/dL (ref 6–24)
CO2: 20 mmol/L (ref 20–29)
Calcium: 9.6 mg/dL (ref 8.7–10.2)
Chloride: 101 mmol/L (ref 96–106)
Creatinine, Ser: 1.14 mg/dL (ref 0.76–1.27)
GFR calc Af Amer: 88 mL/min/{1.73_m2} (ref >59)
GFR calc non Af Amer: 76 mL/min/{1.73_m2} (ref >59)
Glucose: 93 mg/dL (ref 65–99)
POTASSIUM: 4.1 mmol/L (ref 3.5–5.2)
Sodium: 138 mmol/L (ref 134–144)

## 2018-09-07 NOTE — Progress Notes (Signed)
Cardiology Office Note   Date:  09/07/2018   ID:  Brian Harvey, DOB 18-Jun-1971, MRN [id number]  PCP:  System, Pcp Not In  Cardiologist:  Dr. Herbie BaltimoreHarding  No chief complaint on file.     History of Present Illness: Brian Harvey is a 47 y.o. male who was referred by Dr. Herbie BaltimoreHarding for evaluation management of bilateral leg claudication and possible coronary artery disease.  The patient has known history of coronary artery disease status post inferior STEMI in September 2017.  He had RCA and left circumflex PCI with drug-eluting stent placement at that time.  He quit smoking after his cardiac event.  Other medical problems include hyperlipidemia and essential hypertension.  He is not diabetic.  He is currently incarcerated in [place]. He recently complained of bilateral leg pain worse on the right side.  The pain starts in the thigh area and goes all the way down to his calf.  It happens after minimal walking.  He underwent noninvasive vascular studies which showed an ABI of 0.96 on the right and 0.92 on the left.  However, TBI was abnormal on both sides.  Waveforms were also abnormal.  Duplex showed mild SFA disease with occluded right anterior tibial artery and occluded left anterior tibial artery.  Aortoiliac duplex was not performed.  He denies chest pain or shortness of breath.   Past Medical History:  Diagnosis Date  . Alcohol abuse   . CAD S/P percutaneous coronary angioplasty 05/2016   a. 05/2016 Acute inferior STEMI/PCI: LM nl, pLAD 40%, o-p D2 80% (med Rx), small RI- lat branch 99% (med Rx).  Culprit: mLCX 100%-> (2.75x16 Promus Premier DES - 3.0), o-p RCA 30%, mRCA 99% -> (2.5x16 Promus Premier DES - 2.5), EF 55-65%, Mod MR, High LVEDP  . Diastolic dysfunction    a. 05/2016 Echo: EF 55-60%, no rwma, Gr1 DD, mild MR.  Marland Kitchen. History of pancreatitis (in setting of alcoholism)   . Hypertensive heart disease   . ST elevation myocardial infarction (STEMI) of inferolateral wall (HCC)  05/2016   Complicated by Cardiogenic Shock --> 100-% Cx & 99% RCA --> 2V PCI  . Tobacco abuse     Past Surgical History:  Procedure Laterality Date  . CARDIAC CATHETERIZATION N/A 05/28/2016   Procedure: Left Heart Cath and Coronary Angiography;  Surgeon: Peter M SwazilandJordan, MD;  [facility];  Service: Cardiovascular:  LM nl, pLAD 40%, o-p D2 80% (med Rx), small RI- lat branch 99% (med Rx).  Culprit: mLCX 100%-> PCI. O-p RCA 30%, mRCA 99% -> PCI. EF 55-65%, Mod MR, High LVEDP  . CARDIAC CATHETERIZATION N/A 05/28/2016   Procedure: Coronary Stent Intervention;  Surgeon: Peter M SwazilandJordan, MD;  [facility];  Service: Cardiovascular: Culprit: mLCX 100%-> (2.75x16 Promus Premier DES - 3.0); mRCA 99% -> (2.5x16 Promus Premier DES - 2.5)  . TRANSTHORACIC ECHOCARDIOGRAM  05/29/2016   EF 55-60%. Moderate-severe LVH with severe posterior wall hypertrophy. GR 1 DD with high filling pressures. Mild MR     Current Outpatient Medications  Medication Sig Dispense Refill  . amLODipine (NORVASC) 5 MG tablet Take 5 mg by mouth daily.     Marland Kitchen. atorvastatin (LIPITOR) 40 MG tablet Take 1 tablet (40 mg total) by mouth daily. 30 tablet 11  . cilostazol (PLETAL) 50 MG tablet Take 1 tablet (50 mg total) by mouth 2 (two) times daily. 30 tablet 11  . furosemide (LASIX) 20 MG tablet Take 20 mg by  mouth every Monday, Wednesday, and Friday.    . hydrochlorothiazide (HYDRODIURIL) 25 MG tablet Take 1 tablet (25 mg total) by mouth daily. 30 tablet 11  . loratadine (CLARITIN) 10 MG tablet Take 10 mg by mouth daily.    Marland Kitchen losartan (COZAAR) 50 MG tablet Take 50 mg 2 (two) times daily by mouth.    . metoprolol tartrate (LOPRESSOR) 50 MG tablet Take 1 tablet (50 mg total) by mouth 2 (two) times daily. 60 tablet 11  . Multiple Vitamins-Iron (MULTIVITAMINS WITH IRON) TABS tablet Take 1 tablet daily by mouth. 90 tablet 3  . nitroGLYCERIN (NITROSTAT) 0.4 MG SL tablet Place 1 tablet (0.4 mg total) under the tongue  every 5 (five) minutes x 3 doses as needed for chest pain. 25 tablet 3  . ticagrelor (BRILINTA) 60 MG TABS tablet Take 1 tablet (60 mg total) by mouth 2 (two) times daily. 60 tablet 11  . triamcinolone (NASACORT) 55 MCG/ACT AERO nasal inhaler Place 2 sprays into the nose daily.    Marland Kitchen acetaminophen (TYLENOL) 650 MG CR tablet Take 1 tablet (650 mg total) 2 (two) times daily as needed by mouth for pain (FOR HEADACHES AND MUSCLE PAIN). (Patient not taking: Reported on 09/07/2018) 180 tablet 3   No current facility-administered medications for this visit.     Allergies:   Ace inhibitors    Social History:  The patient  reports that he quit smoking about 2 years ago. His smoking use included cigarettes. He has a 37.50 pack-year smoking history. He has never used smokeless tobacco. He reports current alcohol use of about 14.0 standard drinks of alcohol per week. He reports that he does not use drugs.   Family History:  The patient's family history includes Diabetes in his father.    ROS:  Please see the history of present illness.   Otherwise, review of systems are positive for none.   All other systems are reviewed and negative.    PHYSICAL EXAM: VS:  BP 118/82   Pulse 88   Ht 5\' 5"  (1.651 m)   Wt 214 lb (97.1 kg)   BMI 35.61 kg/m  , BMI Body mass index is 35.61 kg/m. GEN: Well nourished, well developed, in no acute distress  HEENT: normal  Neck: no JVD, carotid bruits, or masses Cardiac: RRR; no murmurs, rubs, or gallops,no edema  Respiratory:  clear to auscultation bilaterally, normal work of breathing GI: soft, nontender, nondistended, + BS MS: no deformity or atrophy  Skin: warm and dry, no rash Neuro:  Strength and sensation are intact Psych: euthymic mood, full affect Vascular: Femoral pulses +1 bilaterally.  Posterior tibial is +2 bilaterally.  Dorsalis pedis is not palpable.   EKG:  EKG is not ordered today.    Recent Labs: No results found for requested labs within last  8760 hours.    Lipid Panel    Component Value Date/Time   CHOL 156 05/29/2016 0241   TRIG 163 (H) 05/29/2016 0241   HDL 28 (L) 05/29/2016 0241   CHOLHDL 5.6 05/29/2016 0241   VLDL 33 05/29/2016 0241   LDLCALC 95 05/29/2016 0241      Wt Readings from Last 3 Encounters:  09/07/18 214 lb (97.1 kg)  07/06/18 209 lb (94.8 kg)  07/29/17 186 lb 6.4 oz (84.6 kg)       No flowsheet data found.    ASSESSMENT AND PLAN:  1.  Intermittent claudication: Symptoms seem to be severe.  Interestingly, he has strong palpable posterior tibial  pulse bilaterally.  However, his femoral pulses are decreased on both sides.  There is a possibility of aortoiliac disease as a culprit for his symptoms.  Due to that, I recommend evaluation with CT angiogram the aorta with bilateral femoral runoff.  Further recommendations to follow based on the results of CT angios.  Otherwise, continue treatment of risk factors and small dose cilostazol.  2.  Coronary artery disease involving native coronary arteries without angina: Continue medical therapy.  3.  Hyperlipidemia: Continue atorvastatin with a target LDL of less than 70.  4.  Essential hypertension: Blood pressures controlled on current medications.    Disposition:   FU with me as needed  Signed,  Lorine Bears, MD  09/07/2018 9:11 AM    [facility]

## 2018-09-07 NOTE — Patient Instructions (Signed)
Medication Instructions:  Your physician recommends that you continue on your current medications as directed. Please refer to the Current Medication list given to you today.  If you need a refill on your cardiac medications before your next appointment, please call your pharmacy.   Lab work: Nutritional therapistBmet Today If you have labs (blood work) drawn today and your tests are completely normal, you will receive your results only by: Marland Kitchen. MyChart Message (if you have MyChart) OR . A paper copy in the mail If you have any lab test that is abnormal or we need to change your treatment, we will call you to review the results.  Testing/Procedures: Non-Cardiac CT Angiography (CTA), is a special type of CT scan that uses a computer to produce multi-dimensional views of major blood vessels throughout the body. In CT angiography, a contrast material is injected through an IV to help visualize the blood vessels   Follow-Up: At [facility], you and your health needs are our priority.  As part of our continuing mission to provide you with exceptional heart care, we have created designated Provider Care Teams.  These Care Teams include your primary Cardiologist (physician) and Advanced Practice Providers (APPs -  Physician Assistants and Nurse Practitioners) who all work together to provide you with the care you need, when you need it. . You will need a follow up appointment in as needed with Dr.Arida   Any Other Special Instructions Will Be Listed Below (If Applicable).

## 2018-09-22 DIAGNOSIS — I739 Peripheral vascular disease, unspecified: Secondary | ICD-10-CM

## 2018-09-22 HISTORY — DX: Peripheral vascular disease, unspecified: I73.9

## 2018-10-01 ENCOUNTER — Telehealth: Payer: Self-pay | Admitting: *Deleted

## 2018-10-01 ENCOUNTER — Ambulatory Visit (INDEPENDENT_AMBULATORY_CARE_PROVIDER_SITE_OTHER)
Admission: RE | Admit: 2018-10-01 | Discharge: 2018-10-01 | Disposition: A | Discharge status: Home / Self Care | Source: Ambulatory Visit | Attending: Cardiovascular Disease | Admitting: Cardiovascular Disease

## 2018-10-01 DIAGNOSIS — I739 Peripheral vascular disease, unspecified: Secondary | ICD-10-CM

## 2018-10-01 MED ORDER — IOPAMIDOL (ISOVUE-370) INJECTION 76%
100.0000 mL | Freq: Once | INTRAVENOUS | Status: AC | PRN
  Administered 2018-10-01: 125 mL via INTRAVENOUS

## 2018-10-01 NOTE — Telephone Encounter (Signed)
Left a message for the patient to call back.  

## 2018-10-01 NOTE — Telephone Encounter (Signed)
-----   Message from Iran Ouch, MD sent at 10/01/2018  5:20 PM EST ----- Inform patient that CTA was overall unremarkable with no significant blockages to explain his leg pain.  This is reassuring overall.  No need for angiogram.  Continue medical therapy.

## 2018-10-05 NOTE — Telephone Encounter (Signed)
The patient is currently incarcerated. Call placed to the infirmary at [phone]. Results given to the nurse, Heythaler.

## 2018-11-05 ENCOUNTER — Encounter: Payer: Self-pay | Admitting: Cardiology

## 2018-11-05 ENCOUNTER — Ambulatory Visit (INDEPENDENT_AMBULATORY_CARE_PROVIDER_SITE_OTHER): Admitting: Cardiology

## 2018-11-05 VITALS — BP 124/86 | HR 74 | Ht 65.0 in | Wt 207.0 lb

## 2018-11-05 DIAGNOSIS — E785 Hyperlipidemia, unspecified: Secondary | ICD-10-CM

## 2018-11-05 DIAGNOSIS — M791 Myalgia, unspecified site: Secondary | ICD-10-CM | POA: Insufficient documentation

## 2018-11-05 DIAGNOSIS — Z9861 Coronary angioplasty status: Secondary | ICD-10-CM

## 2018-11-05 DIAGNOSIS — I110 Hypertensive heart disease with heart failure: Secondary | ICD-10-CM

## 2018-11-05 DIAGNOSIS — I2121 ST elevation (STEMI) myocardial infarction involving left circumflex coronary artery: Secondary | ICD-10-CM | POA: Diagnosis not present

## 2018-11-05 DIAGNOSIS — E876 Hypokalemia: Secondary | ICD-10-CM

## 2018-11-05 DIAGNOSIS — I10 Essential (primary) hypertension: Secondary | ICD-10-CM | POA: Diagnosis not present

## 2018-11-05 DIAGNOSIS — I2510 Atherosclerotic heart disease of native coronary artery without angina pectoris: Secondary | ICD-10-CM | POA: Diagnosis not present

## 2018-11-05 DIAGNOSIS — Z720 Tobacco use: Secondary | ICD-10-CM

## 2018-11-05 DIAGNOSIS — I739 Peripheral vascular disease, unspecified: Secondary | ICD-10-CM

## 2018-11-05 DIAGNOSIS — T466X5A Adverse effect of antihyperlipidemic and antiarteriosclerotic drugs, initial encounter: Secondary | ICD-10-CM

## 2018-11-05 DIAGNOSIS — I5032 Chronic diastolic (congestive) heart failure: Secondary | ICD-10-CM

## 2018-11-05 NOTE — Patient Instructions (Signed)
Medication Instructions:   STOP TAKING ATORVASTATIN FOR NOW CONTINUE ALL OTHER MEDICATIONS  If you need a refill on your cardiac medications before your next appointment, please call your pharmacy.   Lab work: PLEASE HAVE FACILITY DRAW LABS - FASTING LIPIDS, CMP, CK  AND SEND RESULT TO OFFICE - FAX [phone] If you have labs (blood work) drawn today and your tests are completely normal, you will receive your results only by: Marland Kitchen MyChart Message (if you have MyChart) OR . A paper copy in the mail If you have any lab test that is abnormal or we need to change your treatment, we will call you to review the results.  Testing/Procedures: NOT NEEDED  Follow-Up: At [facility], you and your health needs are our priority.  As part of our continuing mission to provide you with exceptional heart care, we have created designated Provider Care Teams.  These Care Teams include your primary Cardiologist (physician) and Advanced Practice Providers (APPs -  Physician Assistants and Nurse Practitioners) who all work together to provide you with the care you need, when you need it. . Your physician recommends that you schedule a follow-up appointment in 1 MONTH TO SEE  EXTENEDER  ( PLEASE HAVE FACILITY TO CALL AND MAKE AN APPOINTMENT. Marland Kitchen   Any Other Special Instructions Will Be Listed Below (If Applicable).

## 2018-11-05 NOTE — Progress Notes (Signed)
PCP: System, Pcp Not In  Clinic Note: No chief complaint on file.   HPI: Brian Harvey is a 48 y.o. male with a PMH notable for Inf STEMI with 2 V CAD- PCI in 05/2016 who presents today for 79-month follow-up.  --September 2017: Inferior STEMI with total occlusion of circumflex and subtotal occlusion mid RCA --two-vessel PCI with Promus DES stents. -->  Initially complicated by cardiac shock requiring vasopressors. 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  Brian Harvey was last seen in October 2019 -he was doing OK. -Was supposed to be seen in 6 months, but now presents for almost annual follow-up.  I referred him to Dr. Kirke Corin for evaluation of possible claudication.  (ABIs showed 0.96 and 0.92 (right and left, but TBI's were abnormal).  Seen by Dr. Kirke Corin for ? Claudication - recommended LE CTA reviewed below.  Based on these results, no invasive angiogram recommended.  Plan medical management, continue walking.  Recent Hospitalizations: None  Studies Personally Reviewed - (if available, images/films reviewed: From Epic Chart or Care Everywhere)  LE CTA October 01, 2018: 1. No significant aortoiliac inflow disease. 2. Non critical proximal left renal artery stenosis approaches ~50%. 3. RLE:  Few distal SFA atherosclerotic lesions approaching 50% narrowing. Occlusion of the right anterior tibial artery (RATA) in the distal calf with poor reconstitution distally. Two vessel posterior tibial and peroneal runoff present with dominant posterior tibial runoff.  4. LLE: Areas of distal SFA disease with maximal areas of roughly 50-60% stenosis. Occlusion of the left anterior tibial artery (LATA) in the mid calf with no significant visualized reconstitution distally. Two vessel posterior tibial and peroneal runoff with dominant posterior tibial runoff.   Interval  History: Brian Harvey returns today overall feeling okay from a cardiac standpoint.  He tries to walk around the yard quite a bit but is really limited by bilateral thigh pain and not calf pain.  This is really what limits his activity.  He does not have any chest pain or pressure with rest or exertion.  No exertional dyspnea.  He works out in the [employer] doing lots of heavy and cardio reps.  He denies any chest pain or pressure with that besides normal muscle soreness.   He denies any resting dyspnea.  No PND, orthopnea or edema.  No rapid regular heartbeats or palpitations. No syncope/near syncope or TIA/amaurosis fugax.  No bleeding issues with melena, hematochezia, hematuria epistaxis on maintenance dose of Brilinta.  He says he maybe takes his Lasix once every 2 weeks or so, but really has not used it much recently.  Edema seems to be well controlled.  ROS: A comprehensive was performed. Review of Systems  Constitutional: Negative for malaise/fatigue and weight loss (Weight gain).  HENT: Negative for congestion, nosebleeds and sinus pain.   Respiratory: Negative for cough, shortness of breath and wheezing.   Cardiovascular: Positive for claudication.  Gastrointestinal: Negative for blood in stool, constipation, heartburn and melena.  Genitourinary: Negative for hematuria.  Musculoskeletal: Positive for myalgias. Negative for back pain, falls and joint pain (Off and on arthritis pains).  Neurological: Positive for dizziness (Not as much since reducing amlodipine dose.).  Endo/Heme/Allergies: Negative for environmental allergies. Does not bruise/bleed easily.  Psychiatric/Behavioral: Negative for depression. The patient is not nervous/anxious and does not have insomnia.  All other systems reviewed and are negative.  I have reviewed and (if needed) personally updated the patient's problem list, medications, allergies, past medical and surgical history, social and family history.   Past Medical  History:  Diagnosis Date  . Alcohol abuse   . CAD S/P percutaneous coronary angioplasty 05/2016   a. 05/2016 Acute inferior STEMI/PCI: LM nl, pLAD 40%, o-p D2 80% (med Rx), small RI- lat branch 99% (med Rx).  Culprit: mLCX 100%-> (2.75x16 Promus Premier DES - 3.0), o-p RCA 30%, mRCA 99% -> (2.5x16 Promus Premier DES - 2.5), EF 55-65%, Mod MR, High LVEDP  . History of pancreatitis (in setting of alcoholism)   . Hypertensive heart disease    a. 05/2016 Echo: EF 55-60%, no rwma, Gr1 DD, mild MR.  . Peripheral artery disease (HCC) 09/2018   Abnormal TBI's despite relatively normal ABIs (R ABI 0.96, L ABI 0.92).  ->  Abdominal iliac-lower extremity CTA: No significant aortoiliac flow.  Noncritical proximal L Renal A stenosis (~50%). RSFA -few distal lesions ~50%. RATA 100% (poor reconsititution), 2 V runoff (RPTA dominant).  LSFA - distal ~50-60% stenosis. LATA 100% in mid calf w/ reconstitution. 2 V runoff (LPTA dominant)  . ST elevation myocardial infarction (STEMI) of inferolateral wall (HCC) 05/2016   Complicated by Cardiogenic Shock --> 100-% Cx & 99% RCA --> 2V PCI  . Tobacco abuse    Past Surgical History:  Procedure Laterality Date  . CARDIAC CATHETERIZATION N/A 05/28/2016   Procedure: Left Heart Cath and Coronary Angiography;  Surgeon: Peter M SwazilandJordan, MD;  [facility];  Service: Cardiovascular:  LM nl, pLAD 40%, o-p D2 80% (med Rx), small RI- lat branch 99% (med Rx).  Culprit: mLCX 100%-> PCI. O-p RCA 30%, mRCA 99% -> PCI. EF 55-65%, Mod MR, High LVEDP  . CARDIAC CATHETERIZATION N/A 05/28/2016   Procedure: Coronary Stent Intervention;  Surgeon: Peter M SwazilandJordan, MD;  [facility];  Service: Cardiovascular: Culprit: mLCX 100%-> (2.75x16 Promus Premier DES - 3.0); mRCA 99% -> (2.5x16 Promus Premier DES - 2.5)  . TRANSTHORACIC ECHOCARDIOGRAM  05/29/2016   EF 55-60%. Moderate-severe LVH with severe posterior wall hypertrophy. GR 1 DD with high filling pressures. Mild MR     Cardiac Cath and PCI 05/28/2016: Severe 3 vessel obstructive CAD: Culprit lesion 100% mid LCx (DES PCI with Promus 2.75 mm 60 mm - 3.0 mm).  Also noted  99% mid RCA (DES PCI with Promus 2.25 mm 60 mm-2.5 mm).  80% D2, 99% branch of RI treated medically.  Moderately elevated LVEDP.  Moderate MR. Diagnostic Diagram                                 Post-Intervention Diagram                    Current Outpatient Medications on File Prior to Visit  Medication Sig Dispense Refill  . acetaminophen (TYLENOL) 650 MG CR tablet Take 1 tablet (650 mg total) 2 (two) times daily as needed by mouth for pain (FOR HEADACHES AND MUSCLE PAIN). 180 tablet 3  . amLODipine (NORVASC) 5 MG tablet Take 5 mg by mouth daily.     . cilostazol (PLETAL) 50 MG tablet Take 1 tablet (50 mg total) by mouth 2 (two) times daily. 30 tablet 11  . furosemide (LASIX) 20 MG tablet Take 20 mg by mouth every Monday, Wednesday, and Friday.    .Marland Kitchen  hydrochlorothiazide (HYDRODIURIL) 25 MG tablet Take 1 tablet (25 mg total) by mouth daily. 30 tablet 11  . loratadine (CLARITIN) 10 MG tablet Take 10 mg by mouth daily.    Marland Kitchen losartan (COZAAR) 50 MG tablet Take 50 mg 2 (two) times daily by mouth.    . metoprolol tartrate (LOPRESSOR) 50 MG tablet Take 1 tablet (50 mg total) by mouth 2 (two) times daily. 60 tablet 11  . Multiple Vitamins-Iron (MULTIVITAMINS WITH IRON) TABS tablet Take 1 tablet daily by mouth. 90 tablet 3  . nitroGLYCERIN (NITROSTAT) 0.4 MG SL tablet Place 1 tablet (0.4 mg total) under the tongue every 5 (five) minutes x 3 doses as needed for chest pain. 25 tablet 3  . ticagrelor (BRILINTA) 60 MG TABS tablet Take 1 tablet (60 mg total) by mouth 2 (two) times daily. 60 tablet 11  . triamcinolone (NASACORT) 55 MCG/ACT AERO nasal inhaler Place 2 sprays into the nose daily.     No current facility-administered medications on file prior to visit.     Allergies  Allergen Reactions  . Ace Inhibitors Cough   Social History   Tobacco  Use  . Smoking status: Former Smoker    Packs/day: 1.50    Years: 25.00    Pack years: 37.50    Types: Cigarettes    Last attempt to quit: 01/26/2016    Years since quitting: 2.7  . Smokeless tobacco: Never Used  . Tobacco comment: in prison x 4 months, currently not smoking.  Substance Use Topics  . Alcohol use: Yes    Alcohol/week: 14.0 standard drinks    Types: 14 Cans of beer per week    Comment: in prison x 4 months, currently not drinking.  . Drug use: No   Social History   Social History Narrative   Recently transferred to [facility] facility -hopes to be released from prison the end of 2020.   When not in prison -his home of record is in [place].   Family History   family history includes Diabetes in his father.  Wt Readings from Last 3 Encounters:  11/05/18 207 lb (93.9 kg)  09/07/18 214 lb (97.1 kg)  07/06/18 209 lb (94.8 kg)    PHYSICAL EXAM BP 124/86   Pulse 74   Ht 5\' 5"  (1.651 m)   Wt 207 lb (93.9 kg)   BMI 34.45 kg/m  Physical Exam  Constitutional: He is oriented to person, place, and time. He appears well-developed and well-nourished. No distress.  Healthy-appearing.  Well-groomed  HENT:  Head: Normocephalic and atraumatic.  Neck: Neck supple. No hepatojugular reflux and no JVD present. Carotid bruit is not present.  Cardiovascular: Normal rate, regular rhythm, normal heart sounds and intact distal pulses.  No extrasystoles are present. PMI is not displaced. Exam reveals no gallop and no friction rub.  No murmur heard. Pulmonary/Chest: Effort normal and breath sounds normal. No respiratory distress. He has no wheezes.  Abdominal: Soft. Bowel sounds are normal. He exhibits no distension. There is no abdominal tenderness.  Musculoskeletal: Normal range of motion.        General: No deformity or edema.  Neurological: He is alert and oriented to person, place, and time.  Skin: Skin is warm and dry. No erythema.  Psychiatric: He has a normal mood  and affect. His behavior is normal. Judgment and thought content normal.  Nursing note and vitals reviewed.   Adult ECG Report  Rate: 67;  Rhythm: normal sinus rhythm and Nonspecific ST-T wave  changes.  Normal axis, intervals and durations;   Narrative Interpretation: Relatively normal EKG   Other studies Reviewed: Additional studies/ records that were reviewed today include:   Recent Labs: Unfortunately, none available.   ASSESSMENT / PLAN: Problem List Items Addressed This Visit    CAD S/P PCI to coDom Cx & mRCA with DES - Primary (Chronic)    Two-vessel PCI in the setting of MI.  The circumflex was 100% occluded in the RCA was 99% occluded.  Due to shock.  Both lesions were treated.  He does have residual disease in the diagonal and ramus which are being treated medically. No recurrent anginal symptoms.  No heart failure symptoms.  Plan: Continue maintenance dose Brilinta 60 mg twice daily for secondary prevention --> once discharged from patient, could consider switching to low-dose Xarelto plus aspirin. Continue current dose of beta-blocker, ARB and amlodipine. Continue current dose of atorvastatin       Relevant Orders   Lipid panel   Comprehensive metabolic panel   CK   Essential hypertension (Chronic)    Blood pressure well controlled on current meds.  Back on HCTZ now along with current dose of metoprolol, amlodipine and losartan. PRN Lasix.  Stable.  No change      Hyperlipidemia with target LDL less than 70 (Chronic)    Presumably on 40 mg atorvastatin (listed on 1 med list, but not the other.)  Need to ensure that he is on atorvastatin.  We will ask that he have labs checked at the facility with fasting lipid panel and CMP.       Relevant Orders   Lipid panel   Hypertensive heart disease (Chronic)    Has small vessel and microvascular disease leading to diastolic dysfunction.  Is on adequate afterload reduction with ARB and amlodipine in addition to  beta-blocker.  He is on HCTZ for mild diuresis and therefore not requiring Lasix.  Preserved EF with no active heart failure symptoms.  Euvolemic on exam.      Hypokalemia   Myalgia due to statin    I think his leg pain with walking is probably not related to PAD since he has thigh claudication which would be more consistent with inflow disease.  He actually has occluded anterior tibials bilaterally and denies any calf or foot pain with walking. He is on cilostazol and Brilinta.  Cannot exclude a statin etiology, therefore we will give him 1 month statin holiday in order to reassess his symptoms.  If symptoms do not go away then he should restart statin.  If they do go away, then will probably likely consider switching him to rosuvastatin and follow-up with APP in ~1-2 months      Relevant Orders   Lipid panel   Comprehensive metabolic panel   CK   Peripheral artery disease (HCC)    Moderate SFA disease bilaterally with bilateral ATA occlusion.  With no wounds or sores on his feet, no plans for invasive evaluation.  Plan continue medical management with Brilinta, cilostazol and blood pressure control.  Statin as tolerated.      ST elevation myocardial infarction (STEMI) involving left circumflex coronary artery in recovery phase (HCC) (Chronic)    Initial presentation was complicated by cardiogenic shock and followed by hypertension.  Has preserved EF by echo and no heart failure symptoms at this point.  Seems to have fully recovered with no significant wall motion normality on follow-up echocardiogram. Is on maintenance dose of Brilinta along with statin, beta-blocker, ARB  and amlodipine.      Relevant Orders   Comprehensive metabolic panel   CK   Tobacco abuse    Almost fully quit.        Plan is to return in 1 to 2 months to see an APP to review symptoms off of statin as well as his current lipid panel.  We will need to adjust meds accordingly.  Consider possibly switching  from atorvastatin to rosuvastatin.  Current medicines are reviewed at length with the patient today. (+/- concerns) none The following changes have been made: See below  Patient Instructions  Medication Instructions:   STOP TAKING ATORVASTATIN FOR NOW CONTINUE ALL OTHER MEDICATIONS  If you need a refill on your cardiac medications before your next appointment, please call your pharmacy.   Lab work: PLEASE HAVE FACILITY DRAW LABS - FASTING LIPIDS, CMP, CK  AND SEND RESULT TO OFFICE - FAX [phone] If you have labs (blood work) drawn today and your tests are completely normal, you will receive your results only by: Marland Kitchen MyChart Message (if you have MyChart) OR . A paper copy in the mail If you have any lab test that is abnormal or we need to change your treatment, we will call you to review the results.  Testing/Procedures: NOT NEEDED  Follow-Up: At [facility], you and your health needs are our priority.  As part of our continuing mission to provide you with exceptional heart care, we have created designated Provider Care Teams.  These Care Teams include your primary Cardiologist (physician) and Advanced Practice Providers (APPs -  Physician Assistants and Nurse Practitioners) who all work together to provide you with the care you need, when you need it. . Your physician recommends that you schedule a follow-up appointment in 1 MONTH TO SEE  EXTENEDER  ( PLEASE HAVE FACILITY TO CALL AND MAKE AN APPOINTMENT. Marland Kitchen   Any Other Special Instructions Will Be Listed Below (If Applicable).     --Follow-up with Dr. Herbie Baltimore in 1 year  Studies Ordered:   Orders Placed This Encounter  Procedures  . Lipid panel  . Comprehensive metabolic panel  . CK      [name], M.D., M.S. Interventional Cardiologist   Pager # [phone] Phone # [phone] [address] [place], [place] [address]

## 2018-11-07 ENCOUNTER — Encounter: Payer: Self-pay | Admitting: Cardiology

## 2018-11-07 DIAGNOSIS — I739 Peripheral vascular disease, unspecified: Secondary | ICD-10-CM | POA: Insufficient documentation

## 2018-11-07 NOTE — Assessment & Plan Note (Signed)
Presumably on 40 mg atorvastatin (listed on 1 med list, but not the other.)  Need to ensure that he is on atorvastatin.  We will ask that he have labs checked at the facility with fasting lipid panel and CMP.

## 2018-11-07 NOTE — Assessment & Plan Note (Signed)
Two-vessel PCI in the setting of MI.  The circumflex was 100% occluded in the RCA was 99% occluded.  Due to shock.  Both lesions were treated.  He does have residual disease in the diagonal and ramus which are being treated medically. No recurrent anginal symptoms.  No heart failure symptoms.  Plan: Continue maintenance dose Brilinta 60 mg twice daily for secondary prevention --> once discharged from patient, could consider switching to low-dose Xarelto plus aspirin. Continue current dose of beta-blocker, ARB and amlodipine. Continue current dose of atorvastatin

## 2018-11-07 NOTE — Assessment & Plan Note (Signed)
Almost fully quit.

## 2018-11-07 NOTE — Assessment & Plan Note (Signed)
Initial presentation was complicated by cardiogenic shock and followed by hypertension.  Has preserved EF by echo and no heart failure symptoms at this point.  Seems to have fully recovered with no significant wall motion normality on follow-up echocardiogram. Is on maintenance dose of Brilinta along with statin, beta-blocker, ARB and amlodipine.

## 2018-11-07 NOTE — Assessment & Plan Note (Signed)
I think his leg pain with walking is probably not related to PAD since he has thigh claudication which would be more consistent with inflow disease.  He actually has occluded anterior tibials bilaterally and denies any calf or foot pain with walking. He is on cilostazol and Brilinta.  Cannot exclude a statin etiology, therefore we will give him 1 month statin holiday in order to reassess his symptoms.  If symptoms do not go away then he should restart statin.  If they do go away, then will probably likely consider switching him to rosuvastatin and follow-up with APP in ~1-2 months

## 2018-11-07 NOTE — Assessment & Plan Note (Signed)
Blood pressure well controlled on current meds.  Back on HCTZ now along with current dose of metoprolol, amlodipine and losartan. PRN Lasix.  Stable.  No change

## 2018-11-07 NOTE — Assessment & Plan Note (Signed)
Moderate SFA disease bilaterally with bilateral ATA occlusion.  With no wounds or sores on his feet, no plans for invasive evaluation.  Plan continue medical management with Brilinta, cilostazol and blood pressure control.  Statin as tolerated.

## 2018-11-07 NOTE — Assessment & Plan Note (Signed)
Has small vessel and microvascular disease leading to diastolic dysfunction.  Is on adequate afterload reduction with ARB and amlodipine in addition to beta-blocker.  He is on HCTZ for mild diuresis and therefore not requiring Lasix.  Preserved EF with no active heart failure symptoms.  Euvolemic on exam.

## 2019-01-14 ENCOUNTER — Telehealth: Payer: Self-pay | Admitting: Cardiology

## 2019-01-14 NOTE — Telephone Encounter (Signed)
Misty Stanley with [facility] [phone] called to advise pt can do a telephone visit with Corine Shelter 01/20/19 instead of a video call..  LM for her to call back for consent  and to go over instructions.

## 2019-01-14 NOTE — Telephone Encounter (Signed)
New Message     Brian Harvey is calling about the pts upcoming appt and said he can do a telephone visit but not virtual   Please call

## 2019-01-17 NOTE — Telephone Encounter (Signed)
Virtual Visit Pre-Appointment Phone Call  "Brian Harvey, I am calling you today to discuss your upcoming appointment. We are currently trying to limit exposure to the virus that causes COVID-19 by seeing patients at home rather than in the office."  1. "What is the BEST phone number to call the day of the visit?" - include this in appointment notes  2. "Do you have or have access to (through a family member/friend) a smartphone with video capability that we can use for your visit?" a. If yes - list this number in appt notes as "cell" (if different from BEST phone #) and list the appointment type as a VIDEO visit in appointment notes b. If no - list the appointment type as a PHONE visit in appointment notes  3. Confirm consent - "In the setting of the current Covid19 crisis, you are scheduled for a PHONE visit with your provider on 01/20/2019 at 11:00AM.  Just as we do with many in-office visits, in order for you to participate in this visit, we must obtain consent.  If you'd like, I can send this to your mychart (if signed up) or email for you to review.  Otherwise, I can obtain your verbal consent now.  All virtual visits are billed to your insurance company just like a normal visit would be.  By agreeing to a virtual visit, we'd like you to understand that the technology does not allow for your provider to perform an examination, and thus may limit your provider's ability to fully assess your condition. If your provider identifies any concerns that need to be evaluated in person, we will make arrangements to do so.  Finally, though the technology is pretty good, we cannot assure that it will always work on either your or our end, and in the setting of a video visit, we may have to convert it to a phone-only visit.  In either situation, we cannot ensure that we have a secure connection.  Are you willing to proceed?" STAFF: Did the patient verbally acknowledge consent to telehealth visit? Document YES/NO  here: YES  4. Advise patient to be prepared - "Two hours prior to your appointment, go ahead and check your blood pressure, pulse, oxygen saturation, and your weight (if you have the equipment to check those) and write them all down. When your visit starts, your provider will ask you for this information. If you have an Apple Watch or Kardia device, please plan to have heart rate information ready on the day of your appointment. Please have a pen and paper handy nearby the day of the visit as well."  5. Give patient instructions for MyChart download to smartphone OR Doximity/Doxy.me as below if video visit (depending on what platform provider is using)  6. Inform patient they will receive a phone call 15 minutes prior to their appointment time (may be from unknown caller ID) so they should be prepared to answer    TELEPHONE CALL NOTE  Brian Harvey has been deemed a candidate for a follow-up tele-health visit to limit community exposure during the Covid-19 pandemic. I spoke with the patient via phone to ensure availability of phone/video source, confirm preferred email & phone number, and discuss instructions and expectations.  I reminded Brian Harvey to be prepared with any vital sign and/or heart rhythm information that could potentially be obtained via home monitoring, at the time of his visit. I reminded Brian Harvey to expect a phone call prior to his  visit.  Lucita FerraraYoung, Jesilyn Easom T, CMA 01/17/2019 4:27 PM   INSTRUCTIONS FOR DOWNLOADING THE MYCHART APP TO SMARTPHONE  - The patient must first make sure to have activated MyChart and know their login information - If Apple, go to [employer] Store and type in MyChart in the search bar and download the app. If Android, ask patient to go to [employer] Play Store and type in [facility]Chart in the search bar and download the app. The app is free but as with any other app downloads, their phone may require them to verify saved payment information or Apple/Android  password.  - The patient will need to then log into the app with their MyChart username and password, and select [facility] as their healthcare provider to link the account. When it is time for your visit, go to the MyChart app, find appointments, and click Begin Video Visit. Be sure to Select Allow for your device to access the Microphone and Camera for your visit. You will then be connected, and your provider will be with you shortly.  **If they have any issues connecting, or need assistance please contact MyChart service desk [phone])**  **If using a computer, in order to ensure the best quality for their visit they will need to use either of the following Internet Browsers: [employer] Edge, or Google Chrome**  IF USING DOXIMITY or DOXY.ME - The patient will receive a link just prior to their visit by text.     FULL LENGTH CONSENT FOR TELE-HEALTH VISIT   I hereby voluntarily request, consent and authorize [facility] and its employed or contracted physicians, physician assistants, nurse practitioners or other licensed health care professionals (the Practitioner), to provide me with telemedicine health care services (the "Services") as deemed necessary by the treating Practitioner. I acknowledge and consent to receive the Services by the Practitioner via telemedicine. I understand that the telemedicine visit will involve communicating with the Practitioner through live audiovisual communication technology and the disclosure of certain medical information by electronic transmission. I acknowledge that I have been given the opportunity to request an in-person assessment or other available alternative prior to the telemedicine visit and am voluntarily participating in the telemedicine visit.  I understand that I have the right to withhold or withdraw my consent to the use of telemedicine in the course of my care at any time, without affecting my right to future care or treatment,  and that the Practitioner or I may terminate the telemedicine visit at any time. I understand that I have the right to inspect all information obtained and/or recorded in the course of the telemedicine visit and may receive copies of available information for a reasonable fee.  I understand that some of the potential risks of receiving the Services via telemedicine include:  Marland Kitchen. Delay or interruption in medical evaluation due to technological equipment failure or disruption; . Information transmitted may not be sufficient (e.g. poor resolution of images) to allow for appropriate medical decision making by the Practitioner; and/or  . In rare instances, security protocols could fail, causing a breach of personal health information.  Furthermore, I acknowledge that it is my responsibility to provide information about my medical history, conditions and care that is complete and accurate to the best of my ability. I acknowledge that Practitioner's advice, recommendations, and/or decision may be based on factors not within their control, such as incomplete or inaccurate data provided by me or distortions of diagnostic images or specimens that may result from electronic transmissions. I  understand that the practice of medicine is not an exact science and that Practitioner makes no warranties or guarantees regarding treatment outcomes. I acknowledge that I will receive a copy of this consent concurrently upon execution via email to the email address I last provided but may also request a printed copy by calling the office of [facility].    I understand that my insurance will be billed for this visit.   I have read or had this consent read to me. . I understand the contents of this consent, which adequately explains the benefits and risks of the Services being provided via telemedicine.  . I have been provided ample opportunity to ask questions regarding this consent and the Services and have had my questions  answered to my satisfaction. . I give my informed consent for the services to be provided through the use of telemedicine in my medical care  By participating in this telemedicine visit I agree to the above.

## 2019-01-18 ENCOUNTER — Telehealth: Payer: Self-pay | Admitting: Cardiology

## 2019-01-20 ENCOUNTER — Telehealth: Admitting: Cardiology

## 2019-01-21 ENCOUNTER — Telehealth: Payer: Self-pay | Admitting: Cardiology

## 2019-01-21 NOTE — Telephone Encounter (Signed)
No mychart, no smartphone, pre reg complete 01/21/19 AF

## 2019-01-24 ENCOUNTER — Telehealth: Payer: Self-pay

## 2019-01-24 ENCOUNTER — Encounter: Payer: Self-pay | Admitting: Cardiology

## 2019-01-24 ENCOUNTER — Telehealth (INDEPENDENT_AMBULATORY_CARE_PROVIDER_SITE_OTHER): Admitting: Cardiology

## 2019-01-24 VITALS — BP 158/99 | HR 95 | Temp 97.0°F | Ht 65.0 in | Wt 203.0 lb

## 2019-01-24 DIAGNOSIS — Z720 Tobacco use: Secondary | ICD-10-CM

## 2019-01-24 DIAGNOSIS — I252 Old myocardial infarction: Secondary | ICD-10-CM

## 2019-01-24 DIAGNOSIS — I2510 Atherosclerotic heart disease of native coronary artery without angina pectoris: Secondary | ICD-10-CM | POA: Diagnosis not present

## 2019-01-24 DIAGNOSIS — Z9861 Coronary angioplasty status: Secondary | ICD-10-CM | POA: Diagnosis not present

## 2019-01-24 DIAGNOSIS — E785 Hyperlipidemia, unspecified: Secondary | ICD-10-CM

## 2019-01-24 DIAGNOSIS — I10 Essential (primary) hypertension: Secondary | ICD-10-CM

## 2019-01-24 DIAGNOSIS — I5189 Other ill-defined heart diseases: Secondary | ICD-10-CM

## 2019-01-24 DIAGNOSIS — I739 Peripheral vascular disease, unspecified: Secondary | ICD-10-CM

## 2019-01-24 DIAGNOSIS — Z789 Other specified health status: Secondary | ICD-10-CM | POA: Insufficient documentation

## 2019-01-24 MED ORDER — AMLODIPINE BESYLATE 10 MG PO TABS: 10.0000 mg | ORAL_TABLET | Freq: Every day | ORAL | 3 refills | Status: DC

## 2019-01-24 NOTE — Patient Instructions (Addendum)
Medication Instructions:  HOLD Lipitor for now INCREASE Norvasc to 10mg  Take 1 tablet once a day If you need a refill on your cardiac medications before your next appointment, please call your pharmacy.   Lab work: None  If you have labs (blood work) drawn today and your tests are completely normal, you will receive your results only by: Marland Kitchen MyChart Message (if you have MyChart) OR . A paper copy in the mail If you have any lab test that is abnormal or we need to change your treatment, we will call you to review the results.  Testing/Procedures: None   Follow-Up: At [facility], you and your health needs are our priority.  As part of our continuing mission to provide you with exceptional heart care, we have created designated Provider Care Teams.  These Care Teams include your primary Cardiologist (physician) and Advanced Practice Providers (APPs -  Physician Assistants and Nurse Practitioners) who all work together to provide you with the care you need, when you need it. You will need a follow up appointment in 3 months NEEDS IN OFFICE APPT. You may see [name], MD or one of the following Advanced Practice Providers on your designated Care Team:   Theodore Demark, PA-C . Joni Reining, DNP, ANP  Any Other Special Instructions Will Be Listed Below (If Applicable). CHECK YOUR BLOOD PRESSURE THREE TIMES A WEEK

## 2019-01-24 NOTE — Progress Notes (Signed)
Virtual Visit via Telephone Note   This visit type was conducted due to national recommendations for restrictions regarding the COVID-19 Pandemic (e.g. social distancing) in an effort to limit this patient's exposure and mitigate transmission in our community.  Due to his co-morbid illnesses, this patient is at least at moderate risk for complications without adequate follow up.  This format is felt to be most appropriate for this patient at this time.  The patient did not have access to video technology/had technical difficulties with video requiring transitioning to audio format only (telephone).  All issues noted in this document were discussed and addressed.  No physical exam could be performed with this format.  Please refer to the patient's chart for his  consent to telehealth for [facility].   Date:  01/24/2019   ID:  Brian Harvey, DOB 30-Jan-1971, MRN [id number]  Patient Location: Other:  [facility] Provider Location: Home  PCP:  System, Pcp Not In  Cardiologist:  [name], MD  Electrophysiologist:  None   Evaluation Performed:  Follow-Up Visit  Chief Complaint:  none  History of Present Illness:    Brian Harvey is a 48 y.o. male with a history of ST EMI September 2017 complicated by cardiogenic shock.  He underwent RCA and circumflex intervention.  The patient has been incarcerated.  He last saw Dr. Herbie Baltimore in February.  At that time he had had some leg pain that we thought may be claudication.  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  Dr. Herbie Baltimore asked him to hold his statin therapy to see if this may be the cause of his leg discomfort.  He was set up for this visit to follow-up from that.  I contacted the [facility]  I spoke with the nurse, Misty Stanley, while the patient was in the room answering questions as well.  He says his leg pain is better off the statin therapy.  He is not had problems with his other medications.  He is not  had chest pain.  Unfortunately someone in his dorm tested positive for CO VID and he has been quarantined for the last 2 weeks.  The patient himself has no fever and no chest pain.  He has not been tested.  His blood pressure was elevated today at 158/99.  [facility].  The patient does not have symptoms concerning for COVID-19 infection (fever, chills, cough, or new shortness of breath).    Past Medical History:  Diagnosis Date  . Alcohol abuse   . CAD S/P percutaneous coronary angioplasty 05/2016   a. 05/2016 Acute inferior STEMI/PCI: LM nl, pLAD 40%, o-p D2 80% (med Rx), small RI- lat branch 99% (med Rx).  Culprit: mLCX 100%-> (2.75x16 Promus Premier DES - 3.0), o-p RCA 30%, mRCA 99% -> (2.5x16 Promus Premier DES - 2.5), EF 55-65%, Mod MR, High LVEDP  . History of pancreatitis (in setting of alcoholism)   . Hypertensive heart disease    a. 05/2016 Echo: EF 55-60%, no rwma, Gr1 DD, mild MR.  . Peripheral artery disease (HCC) 09/2018   Abnormal TBI's despite relatively normal ABIs (R ABI 0.96, L ABI 0.92).  ->  Abdominal iliac-lower extremity CTA: No significant aortoiliac flow.  Noncritical proximal L Renal A stenosis (~50%). RSFA -few distal lesions ~50%. RATA 100% (poor reconsititution), 2 V runoff (RPTA dominant).  LSFA - distal ~50-60% stenosis. LATA 100% in mid  calf w/ reconstitution. 2 V runoff (LPTA dominant)  . ST elevation myocardial infarction (STEMI) of inferolateral wall (HCC) 05/2016   Complicated by Cardiogenic Shock --> 100-% Cx & 99% RCA --> 2V PCI  . Tobacco abuse    Past Surgical History:  Procedure Laterality Date  . CARDIAC CATHETERIZATION N/A 05/28/2016   Procedure: Left Heart Cath and Coronary Angiography;  Surgeon: Peter M SwazilandJordan, MD;  [facility];  Service: Cardiovascular:  LM nl, pLAD 40%, o-p D2 80% (med Rx), small RI- lat branch 99% (med Rx).  Culprit: mLCX 100%-> PCI. O-p RCA 30%, mRCA 99% -> PCI. EF  55-65%, Mod MR, High LVEDP  . CARDIAC CATHETERIZATION N/A 05/28/2016   Procedure: Coronary Stent Intervention;  Surgeon: Peter M SwazilandJordan, MD;  [facility];  Service: Cardiovascular: Culprit: mLCX 100%-> (2.75x16 Promus Premier DES - 3.0); mRCA 99% -> (2.5x16 Promus Premier DES - 2.5)  . TRANSTHORACIC ECHOCARDIOGRAM  05/29/2016   EF 55-60%. Moderate-severe LVH with severe posterior wall hypertrophy. GR 1 DD with high filling pressures. Mild MR     Current Meds  Medication Sig  . acetaminophen (TYLENOL) 650 MG CR tablet Take 1 tablet (650 mg total) 2 (two) times daily as needed by mouth for pain (FOR HEADACHES AND MUSCLE PAIN).  Marland Kitchen. amLODipine (NORVASC) 5 MG tablet Take 5 mg by mouth daily.   . cilostazol (PLETAL) 50 MG tablet Take 1 tablet (50 mg total) by mouth 2 (two) times daily.  . furosemide (LASIX) 20 MG tablet Take 20 mg by mouth every Monday, Wednesday, and Friday.  . hydrochlorothiazide (HYDRODIURIL) 25 MG tablet Take 1 tablet (25 mg total) by mouth daily.  Marland Kitchen. loratadine (CLARITIN) 10 MG tablet Take 10 mg by mouth daily.  Marland Kitchen. losartan (COZAAR) 50 MG tablet Take 50 mg 2 (two) times daily by mouth.  . metoprolol tartrate (LOPRESSOR) 50 MG tablet Take 1 tablet (50 mg total) by mouth 2 (two) times daily.  . Multiple Vitamins-Iron (MULTIVITAMINS WITH IRON) TABS tablet Take 1 tablet daily by mouth.  . nitroGLYCERIN (NITROSTAT) 0.4 MG SL tablet Place 1 tablet (0.4 mg total) under the tongue every 5 (five) minutes x 3 doses as needed for chest pain.  . ticagrelor (BRILINTA) 60 MG TABS tablet Take 1 tablet (60 mg total) by mouth 2 (two) times daily.  Marland Kitchen. triamcinolone (NASACORT) 55 MCG/ACT AERO nasal inhaler Place 2 sprays into the nose daily.     Allergies:   Ace inhibitors   Social History   Tobacco Use  . Smoking status: Former Smoker    Packs/day: 1.50    Years: 25.00    Pack years: 37.50    Types: Cigarettes    Last attempt to quit: 01/26/2016    Years since quitting: 2.9   . Smokeless tobacco: Never Used  . Tobacco comment: in prison x 4 months, currently not smoking.  Substance Use Topics  . Alcohol use: Yes    Alcohol/week: 14.0 standard drinks    Types: 14 Cans of beer per week    Comment: in prison x 4 months, currently not drinking.  . Drug use: No     Family Hx: The patient's family history includes Diabetes in his father.  ROS:   Please see the history of present illness.    All other systems reviewed and are negative.   Prior CV studies:   The following studies were reviewed today:   Labs/Other Tests and Data Reviewed:    EKG:  No ECG reviewed.  Recent Labs: 09/07/2018: BUN 19; Creatinine, Ser 1.14; Potassium 4.1; Sodium 138   Recent Lipid Panel Lab Results  Component Value Date/Time   CHOL 156 05/29/2016 02:41 AM   TRIG 163 (H) 05/29/2016 02:41 AM   HDL 28 (L) 05/29/2016 02:41 AM   CHOLHDL 5.6 05/29/2016 02:41 AM   LDLCALC 95 05/29/2016 02:41 AM    Wt Readings from Last 3 Encounters:  01/24/19 203 lb (92.1 kg)  11/05/18 207 lb (93.9 kg)  09/07/18 214 lb (97.1 kg)     Objective:    Vital Signs:  BP (!) 158/99   Pulse 95   Temp (!) 97 F (36.1 C)   Ht 5\' 5"  (1.651 m)   Wt 203 lb (92.1 kg)   SpO2 95%   BMI 33.78 kg/m    VITAL SIGNS:  reviewed  ASSESSMENT & PLAN:    Statin intolerance- Leg pain improved off statin Rx  H/O STEMI- Sept 2017, complicated by shock, treated with 2V PCI  CAD- S/p CFX and RCA PCI with DES Sept 2017  HTN- Poor control- increase Norvasc to 10 mg and f/u B/P at the jail.  COVID-19 Education: The signs and symptoms of COVID-19 were discussed with the patient and how to seek care for testing (follow up with PCP or arrange E-visit).  The importance of social distancing was discussed today.  Time:   Today, I have spent 15 minutes with the patient with telehealth technology discussing the above problems.     Medication Adjustments/Labs and Tests Ordered: Current medicines are  reviewed at length with the patient today.  Concerns regarding medicines are outlined above.   Tests Ordered: No orders of the defined types were placed in this encounter.   Medication Changes: No orders of the defined types were placed in this encounter.   Disposition:  Follow up Two months in the office with Dr Herbie Baltimore or his APP  Signed, Corine Shelter, PA-C  01/24/2019 11:11 AM    [facility]

## 2019-01-26 MED ORDER — AMLODIPINE BESYLATE 10 MG PO TABS: 10.0000 mg | ORAL_TABLET | Freq: Every day | ORAL | 12 refills | Status: AC

## 2019-01-26 NOTE — Telephone Encounter (Signed)
Contacted [place] at [employer] to discuss AVS instructions. Misty Stanley agreeable with Franky Macho recommendations and voiced understanding.

## 2019-01-31 NOTE — Telephone Encounter (Signed)
This encounter was created in error - please disregard.

## 2019-03-14 IMAGING — CT CT ANGIO AOBIFEM WO/W CM
2 of 12 series · 12 of 46 positions shown, 15 images · IV contrast (iopamidol)
Comparison: Reports from prior noninvasive studies on 08/17/2018

CLINICAL DATA: Bilateral lower extremity claudication. Noninvasive
studies demonstrate resting ankle-brachial indices of 0.96 on the
right and 0.92 on the left. Arterial duplex demonstrates bilateral
anterior tibial artery occlusions.

EXAM:
CT ANGIOGRAPHY OF ABDOMINAL AORTA WITH ILIOFEMORAL RUNOFF
TECHNIQUE: Multidetector CT imaging of the abdomen, pelvis and lower
extremities was performed using the standard protocol during bolus
administration of intravenous contrast. Multiplanar CT image
reconstructions and MIPs were obtained to evaluate the vascular
anatomy.
CONTRAST:  125mL EFF4OW-D1M IOPAMIDOL (EFF4OW-D1M) INJECTION 76%

[Series 4: angiorunoff 3.0 i40f 3 · axial · 0.71mm/px · z∈[+92,+1306]mm · 11 of 456 slices shown, 13 images]
[im 34/456  soft-tissue]
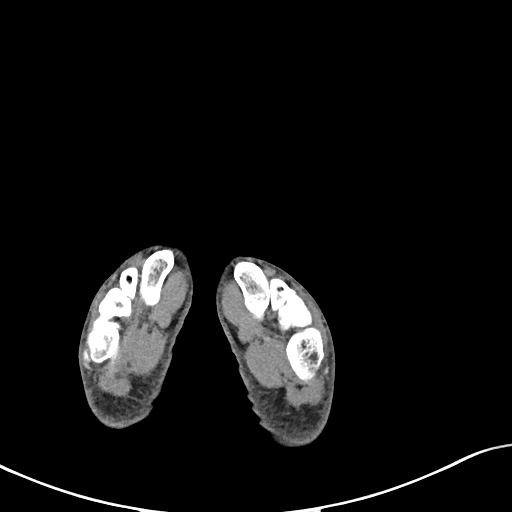
[im 34/456  bone]
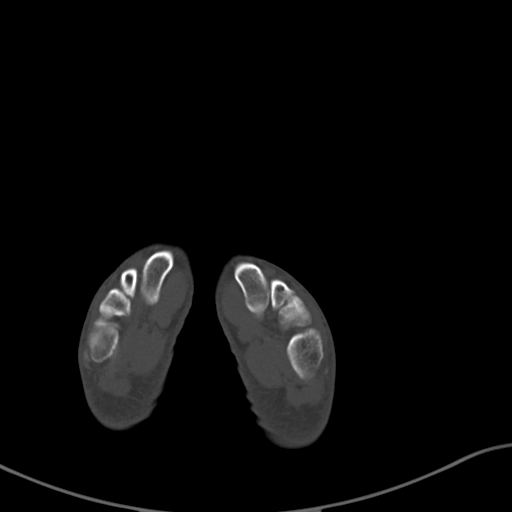
[im 85/456  soft-tissue]
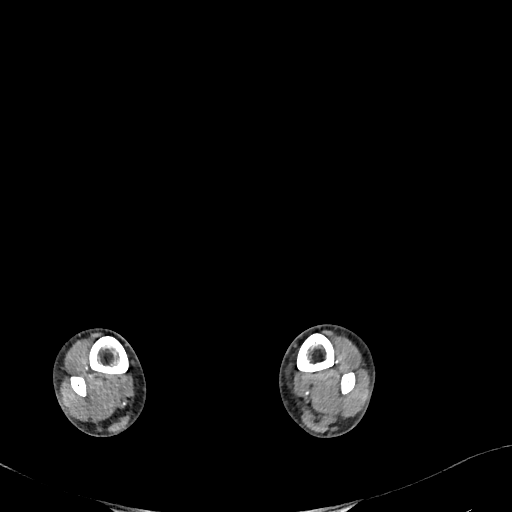
[im 152/456  soft-tissue]
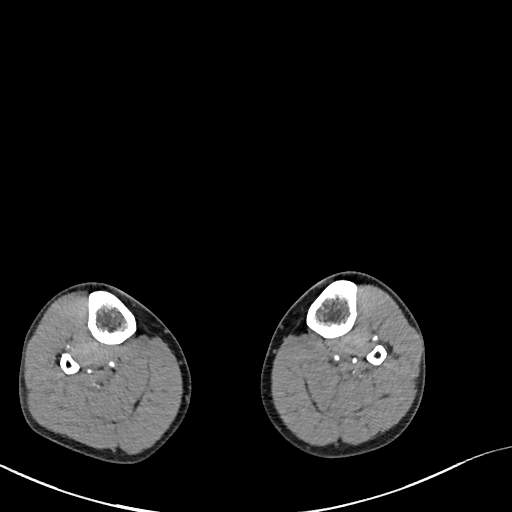
[im 203/456  soft-tissue]
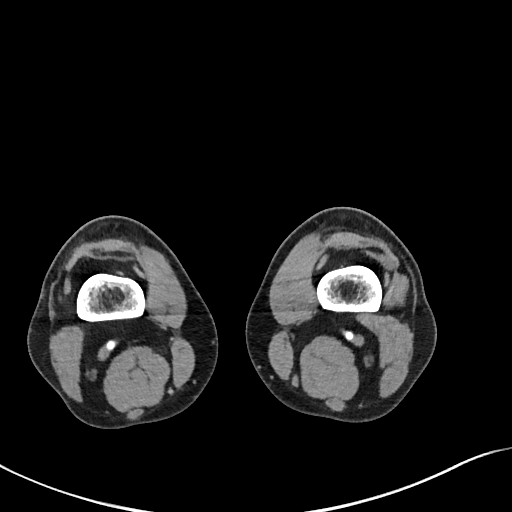
[im 253/456  soft-tissue]
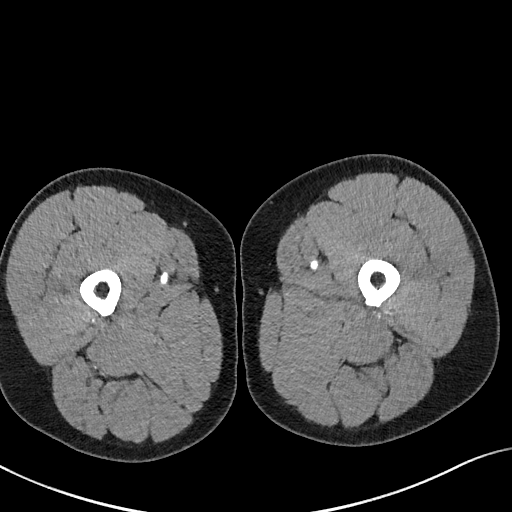
[im 304/456  soft-tissue]
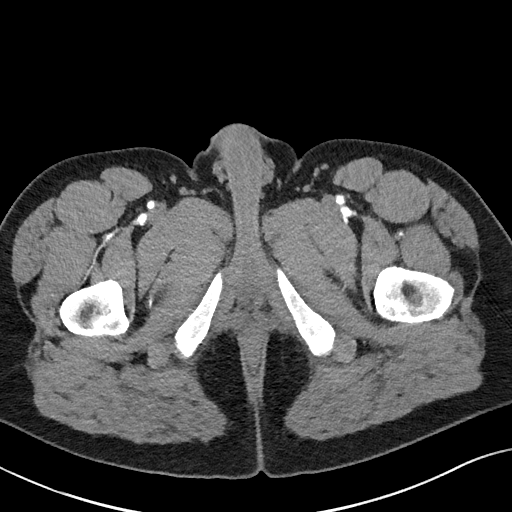
[im 371/456  soft-tissue]
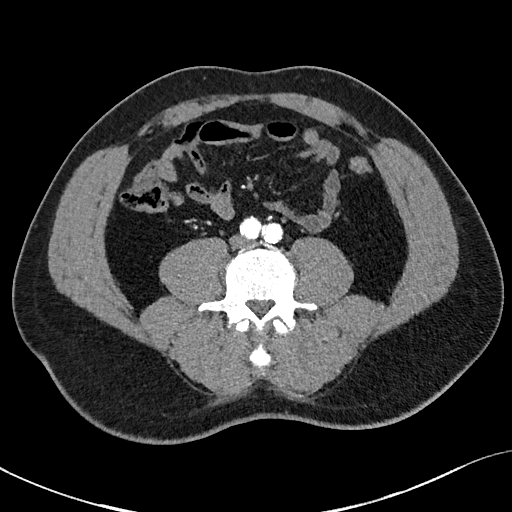
[im 388/456  lung]
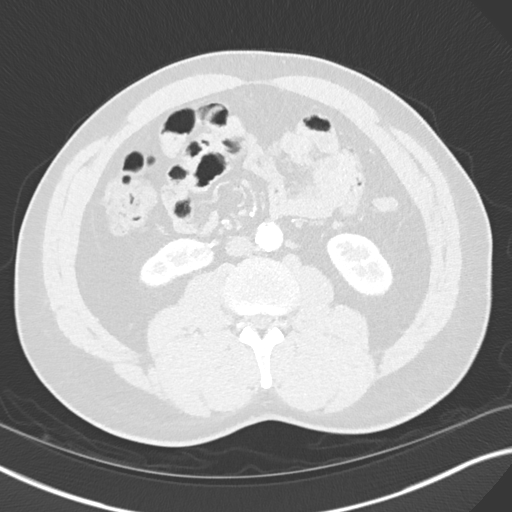
[im 405/456  lung]
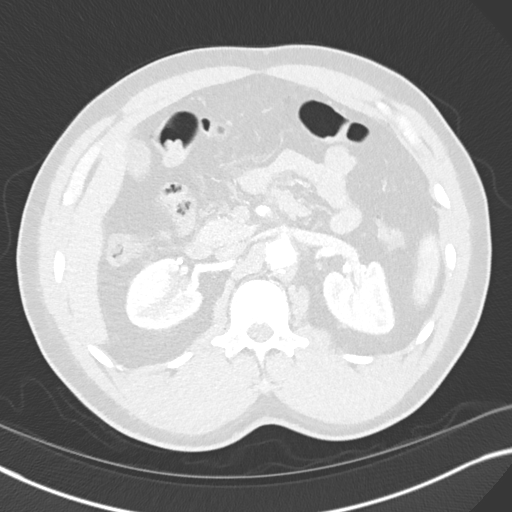
[im 422/456  soft-tissue]
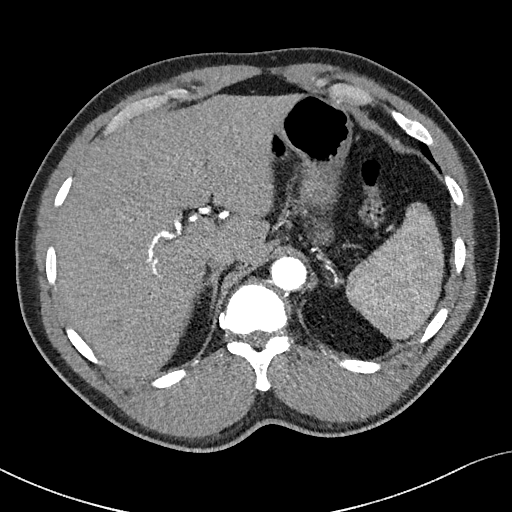
[im 422/456  lung]
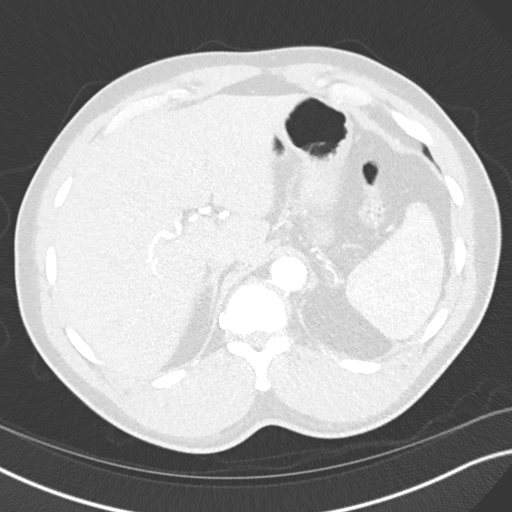
[im 439/456  lung]
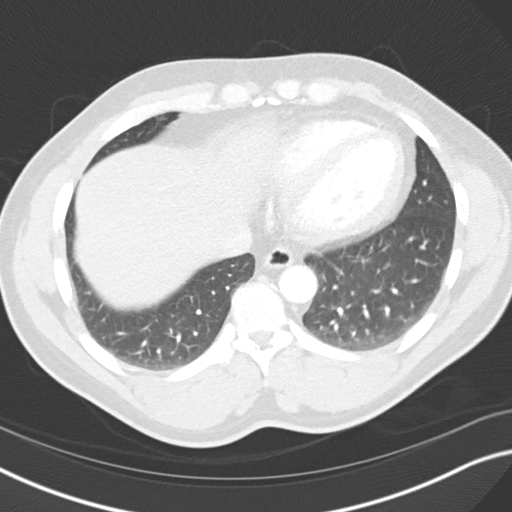

[Series 6: coronal upper · coronal · 0.81mm/px · 1 of 151 slices shown, 2 images]
[im 76/151  soft-tissue]
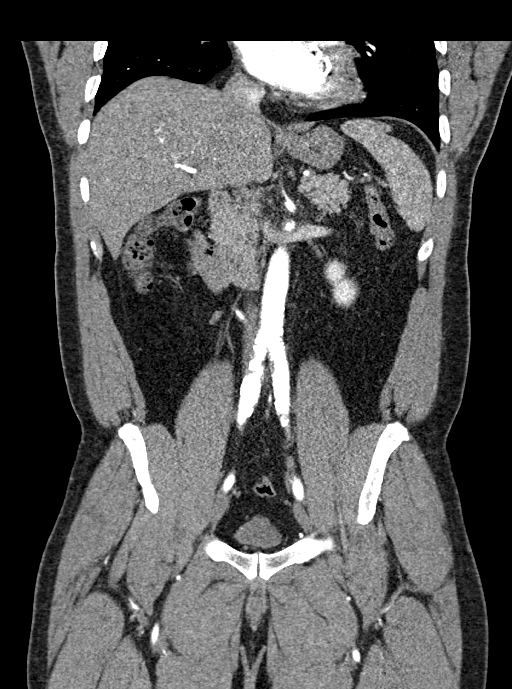
[im 76/151  bone]
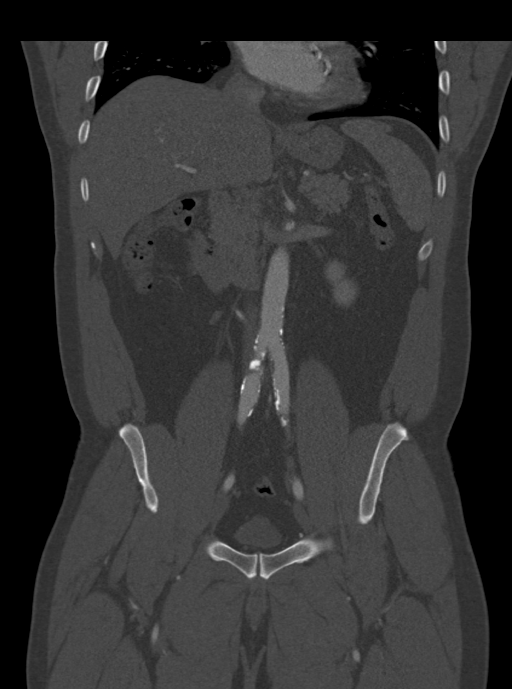

[12 of 46 positions shown; findings below may reference images not displayed]

FINDINGS: VASCULAR

Aorta: The abdominal aorta demonstrates scattered atherosclerotic
plaque without evidence of aneurysm, significant stenosis or
dissection.

Celiac: Normally patent.  Normal distal branch vessel anatomy.

SMA: Mild noncalcified plaque in the proximal SMA trunk without
evidence of significant stenosis. Distal branch vessels are normally
patent.

Renals: Bilateral single renal arteries present. Mild amount of
calcified and noncalcified plaque in both proximal renal arteries.
No significant stenosis on the right. Focal narrowing in the
proximal left renal artery approximately 7 mm from the origin
approaches 50% in maximal caliber.

IMA: Normally patent.

RIGHT Lower Extremity

Inflow: Calcified and noncalcified plaque throughout the right
common iliac artery without evidence of stenosis. Proximal right
internal iliac artery trunk shows diffuse disease with maximal
narrowing of approximately 70%. The external iliac artery is
normally patent.

Outflow: The common femoral artery shows a mild amount of calcified
plaque without significant stenosis. The femoral bifurcation is
normally patent. Profunda femoral artery is normally patent. The SFA
shows scattered plaque throughout its course. There are a few areas
moderate narrowing in the distal SFA approaching 50% stenosis. The
popliteal artery is normally patent.

Runoff: Continuously patent posterior tibial and peroneal runoff
present with small caliber peroneal artery. The anterior tibial
artery is small and initially patent. There does appear to be
probable segmental occlusion of the anterior tibial artery in the
distal calf with poor reconstitution distally.

LEFT Lower Extremity

Inflow: The left common iliac artery shows diffuse calcified and
noncalcified plaque without evidence of stenosis or aneurysm. Mild
plaque in the proximal external iliac artery without evidence of
significant stenosis. The internal iliac artery shows scattered
disease without occlusion or high-grade stenosis.

Outflow: Common femoral artery shows mild plaque distally without
significant stenosis. The femoral bifurcation is normally patent.
Profunda femoral artery is normally patent. The SFA demonstrates
scattered plaque throughout its course. Calcified plaque causes
maximal areas of roughly 50-60% narrowing in the distal SFA. The
popliteal artery is normally patent.

Runoff: Continuous posterior tibial and peroneal runoff
demonstrated. The peroneal artery is small. The anterior tibial
artery is initially patent and becomes occluded likely at the mid
calf level. There is poor, if any, reconstitution distally of the
anterior tibial artery.

Review of the MIP images confirms the above findings.

NON-VASCULAR

Lower chest: No acute abnormality.

Hepatobiliary: Rounded area arterial perfusion in the posterior
subcapsular right lobe of the liver measures roughly 2 cm in
diameter on image number 43, series 4. This is consistent with a
hemangioma. The gallbladder and bile ducts appear unremarkable.

Pancreas: Unremarkable. No pancreatic ductal dilatation or
surrounding inflammatory changes.

Spleen: Normal in size without focal abnormality.

Adrenals/Urinary Tract: Adrenal glands are unremarkable. Kidneys are
normal, without renal calculi, focal lesion, or hydronephrosis.
Bladder is unremarkable.

Stomach/Bowel: Bowel shows no evidence of obstruction, ileus or
obvious lesion. No free air.

Lymphatic: No enlarged lymph nodes identified.

Reproductive: Prostate is unremarkable.

Other: No abdominal wall hernia or abnormality. No abdominopelvic
ascites.

Musculoskeletal: No acute or significant osseous findings.
IMPRESSION: VASCULAR

1. No significant aortoiliac inflow disease.
2. Non critical proximal left renal artery stenosis approaches 50%
in estimated caliber.
3. Right lower extremity: Few distal SFA atherosclerotic lesions
approaching 50% narrowing. Occlusion of the right anterior tibial
artery in the distal calf with poor reconstitution distally. Two
vessel posterior tibial and peroneal runoff present with dominant
posterior tibial runoff.
4. Left lower extremity: Areas of distal SFA disease with maximal
areas of roughly 50-60% stenosis. Occlusion of the left anterior
tibial artery in the mid calf with no significant visualized
reconstitution distally. Two vessel posterior tibial and peroneal
runoff with dominant posterior tibial runoff.

NON-VASCULAR

2 cm hypervascular subcapsular lesion in the posterior right lobe of
the liver consistent with hemangioma.

## 2019-03-21 NOTE — Telephone Encounter (Signed)
Opened in error

## 2019-03-30 ENCOUNTER — Telehealth: Admitting: Cardiology

## 2019-05-24 ENCOUNTER — Ambulatory Visit: Admitting: Cardiology

## 2019-06-14 ENCOUNTER — Telehealth: Payer: Self-pay | Admitting: *Deleted

## 2019-06-14 NOTE — Telephone Encounter (Signed)
Called to confirm  Patient will be available FOR TELEVISIT ON 9/24/ AT 1:40 SPOKE TO V. ROLAND.  VITALS AND MEDICATION WILL GATHERED IF PATIENT IS HAVING ISSUE AN EKG WILL BE FAXED PRIOR TO VISIT .  CONTACT THE NUMBER FORM TODAY'S CALL

## 2019-06-16 ENCOUNTER — Telehealth (INDEPENDENT_AMBULATORY_CARE_PROVIDER_SITE_OTHER): Admitting: Cardiology

## 2019-06-16 ENCOUNTER — Encounter: Payer: Self-pay | Admitting: Cardiology

## 2019-06-16 VITALS — BP 128/83 | HR 79 | Temp 97.2°F | Resp 18 | Ht 65.0 in | Wt 206.0 lb

## 2019-06-16 DIAGNOSIS — Z9861 Coronary angioplasty status: Secondary | ICD-10-CM

## 2019-06-16 DIAGNOSIS — I2510 Atherosclerotic heart disease of native coronary artery without angina pectoris: Secondary | ICD-10-CM | POA: Diagnosis not present

## 2019-06-16 DIAGNOSIS — E785 Hyperlipidemia, unspecified: Secondary | ICD-10-CM | POA: Diagnosis not present

## 2019-06-16 DIAGNOSIS — T466X5A Adverse effect of antihyperlipidemic and antiarteriosclerotic drugs, initial encounter: Secondary | ICD-10-CM

## 2019-06-16 DIAGNOSIS — I739 Peripheral vascular disease, unspecified: Secondary | ICD-10-CM

## 2019-06-16 DIAGNOSIS — M791 Myalgia, unspecified site: Secondary | ICD-10-CM

## 2019-06-16 DIAGNOSIS — I10 Essential (primary) hypertension: Secondary | ICD-10-CM

## 2019-06-16 DIAGNOSIS — I25119 Atherosclerotic heart disease of native coronary artery with unspecified angina pectoris: Secondary | ICD-10-CM | POA: Diagnosis not present

## 2019-06-16 DIAGNOSIS — Z720 Tobacco use: Secondary | ICD-10-CM

## 2019-06-16 NOTE — Patient Instructions (Addendum)
Medication Instructions:  For the GI upset:  Hold cilostazol/Pletal for 1 week (do not take for 1 week) -->   after 1 week, if the leg discomfort with walking has not changed then simply stop  If leg pain comes back, then reduce dose to 1/2 tablet (50 mg) twice a day  Continue to hold the cholesterol medicine, but we may need to start a new medicine for cholesterol pending the results of your lab work.  If you need a refill on your cardiac medications before your next appointment, please call your pharmacy.   Lab work:  Will need to have the Infirmary Nurse check fasting blood work and send [place] the results;  Fasting lipid panel, complete metabolic panel --PLEASE FAX RESULTS TO [phone]   OFFICE  [address], [place] [address]    Testing/Procedures:  NOT NEEDED  Follow-Up: At [facility], you and your health needs are our priority.  As part of our continuing mission to provide you with exceptional heart care, we have created designated Provider Care Teams.  These Care Teams include your primary Cardiologist (physician) and Advanced Practice Providers (APPs -  Physician Assistants and Nurse Practitioners) who all work together to provide you with the care you need, when you need it. . F/u appt in 6 months ( march  2021)  with Kerin Ransom, [place] in office  .  Marland Kitchen You will need a follow up appointment with Dr. Ellyn Hack in  12  Months - SEPT 2021.  Please call our office 2 months in advance to schedule this appointment.    Any Other Special Instructions Will Be Listed Below (If Applicable).  Keep exercising.

## 2019-06-16 NOTE — Progress Notes (Signed)
Virtual Visit via Telephone Note   This visit type was conducted due to national recommendations for restrictions regarding the COVID-19 Pandemic (e.g. social distancing) in an effort to limit this patient's exposure and mitigate transmission in our community.  Due to his co-morbid illnesses, this patient is at least at moderate risk for complications without adequate follow up.  This format is felt to be most appropriate for this patient at this time.  The patient did not have access to video technology/had technical difficulties with video requiring transitioning to audio format only (telephone).  All issues noted in this document were discussed and addressed.  No physical exam could be performed with this format.  Please refer to the patient's chart for his  consent to telehealth for [facility].   Patient has given verbal permission to conduct this visit via virtual appointment and to bill insurance 06/17/2019 4:28 PM    Current episode Evaluation Performed:  Follow-up visit  Date:  06/17/2019   ID:  Brian Harvey, DOB 1970-10-11, MRN [id number]  Patient Location: Other:  [facility] (Unable to do Video Conferencing) Provider Location: Home  PCP:  System, Pcp Not In  Cardiologist:  Glenetta Hew, MD  Electrophysiologist:  None   Chief Complaint:  ~4 month f/u  History of Present Illness:    Brian Harvey is a 48 y.o. male with PMH notable for history of Inferior STEMI with two-vessel CAD (DES PCI to 100% LCx & 99% RCA, small RI-side branch 99% & 90% D2 - med Rx) who presents via audio/video conferencing for a telehealth visit today.  Sheron D Semmel was last seen via telemedicine (@ [place]) on Jan 24, 2019 by Kerin Ransom, [place].  Disposition to follow-up to see how his leg discomfort was doing off of statin.  He had recently been seen by Dr. Fletcher Anon for possible claudication and was found to have distal lower extremity disease but with inline flow in 2 out of 3  vessels. (Had to be quarantined for 2 weeks because of fellow inmate had tested positive for COVID) --> At that time he was not having any issues with chest pain and his leg discomfort had improved since stopping statin. --> Unfortunately, labs were not checked --> Was hypertensive, Norvasc increased to 10 mg  Interval History: Pt was contacted through [facility]. No longer @ [facility].   Notes that leg aching notably improved since being off Statin.  Notes that the bottoms of his feet get numb.  Able to get out in the yard & walk around for about an hour.  No complaint of chest pain (but doesn't really do much exertion).  Legs not hurting nearly as much when he walks.  No DOE.  Some occasional SOB after taking Brlinta. No bleeding issues.   Cardiovascular ROS: no chest pain or dyspnea on exertion positive for - shortness of breath and usually with vigourous exertion & after Brilinta & much improved claudication negative for - edema, irregular heartbeat, orthopnea, palpitations, paroxysmal nocturnal dyspnea, rapid heart rate or syncope / near syncope; TIA/amaurosis fugax   Notes swelling stable with 3 x week lasix.  Feels bloated & full in Am after taking medications.   The patient does not have symptoms concerning for COVID-19 infection (fever, chills, cough, or new shortness of breath).  The patient is practicing social distancing.  ROS:  Please see the history of present illness.    Review of Systems  Constitutional: Negative for malaise/fatigue  and weight loss.  HENT: Negative for congestion and nosebleeds.   Respiratory: Negative for cough, shortness of breath and wheezing.   Cardiovascular: Negative for leg swelling (controlled with Lasix).  Gastrointestinal: Negative for blood in stool and melena.       See HPI   Genitourinary: Negative for hematuria.  Musculoskeletal: Negative for falls and joint pain.       Leg myalgias much better  Neurological: Positive  for tingling (bottoms of feet). Negative for focal weakness.  Psychiatric/Behavioral: Negative for memory loss. The patient is not nervous/anxious and does not have insomnia.   All other systems reviewed and are negative.   Past Medical History:  Diagnosis Date  . Alcohol abuse   . CAD S/P percutaneous coronary angioplasty 05/2016   a. 05/2016 Acute inferior STEMI/PCI: LM nl, pLAD 40%, o-p D2 80% (med Rx), small RI- lat branch 99% (med Rx).  Culprit: mLCX 100%-> (2.75x16 Promus Premier DES - 3.0), o-p RCA 30%, mRCA 99% -> (2.5x16 Promus Premier DES - 2.5), EF 55-65%, Mod MR, High LVEDP  . History of pancreatitis (in setting of alcoholism)   . Hypertensive heart disease    a. 05/2016 Echo: EF 55-60%, no rwma, Gr1 DD, mild MR.  . Peripheral artery disease (HCC) 09/2018   Abnormal TBI's despite relatively normal ABIs (R ABI 0.96, L ABI 0.92).  ->  Abdominal iliac-lower extremity CTA: No significant aortoiliac flow.  Noncritical proximal L Renal A stenosis (~50%). RSFA -few distal lesions ~50%. RATA 100% (poor reconsititution), 2 V runoff (RPTA dominant).  LSFA - distal ~50-60% stenosis. LATA 100% in mid calf w/ reconstitution. 2 V runoff (LPTA dominant)  . ST elevation myocardial infarction (STEMI) of inferolateral wall (HCC) 05/2016   Complicated by Cardiogenic Shock --> 100-% Cx & 99% RCA --> 2V PCI  . Tobacco abuse    Past Surgical History:  Procedure Laterality Date  . CARDIAC CATHETERIZATION N/A 05/28/2016   Procedure: Left Heart Cath and Coronary Angiography;  Surgeon: Peter M Swaziland, MD;  Location: [facility] INVASIVE CV LAB;  Service: Cardiovascular:  LM nl, pLAD 40%, o-p D2 80% (med Rx), small RI- lat branch 99% (med Rx).  Culprit: mLCX 100%-> PCI. O-p RCA 30%, mRCA 99% -> PCI. EF 55-65%, Mod MR, High LVEDP  . CARDIAC CATHETERIZATION N/A 05/28/2016   Procedure: Coronary Stent Intervention;  Surgeon: Peter M Swaziland, MD;  Location[facility] INVAS[facility] CV LAB;  Service: Cardiovascular: Culprit: mLCX 100%->  (2.75x16 Promus Premier DES - 3.0); mRCA 99% -> (2.5x16 Promus Premier DES - 2.5)  . TRANSTHORACIC ECHOCARDIOGRAM  05/29/2016   EF 55-60%. Moderate-severe LVH with severe posterior wall hypertrophy. GR 1 DD with high filling pressures. Mild MR    September 2017 acute inferior STEMIPost-Intervention Diagram    Current Meds  Medication Sig  . acetaminophen (TYLENOL) 650 MG CR tablet Take 1 tablet (650 mg total) 2 (two) times daily as needed by mouth for pain (FOR HEADACHES AND MUSCLE PAIN).  Marland Kitchen amLODipine (NORVASC) 10 MG tablet Take 1 tablet (10 mg total) by mouth daily.  . cilostazol (PLETAL) 100 MG tablet Take 100 mg by mouth 2 (two) times daily.  . furosemide (LASIX) 20 MG tablet Take 20 mg by mouth every Monday, Wednesday, and Friday.  . hydrochlorothiazide (HYDRODIURIL) 25 MG tablet Take 1 tablet (25 mg total) by mouth daily.  Marland Kitchen loratadine (CLARITIN) 10 MG tablet Take 10 mg by mouth daily.  Marland Kitchen losartan (COZAAR) 50 MG tablet Take 50 mg 2 (two) times daily by  mouth.  . metoprolol tartrate (LOPRESSOR) 50 MG tablet Take 1 tablet (50 mg total) by mouth 2 (two) times daily.  . Multiple Vitamins-Iron (MULTIVITAMINS WITH IRON) TABS tablet Take 1 tablet daily by mouth.  . Naphazoline-Pheniramine 0.027-0.315 % SOLN Apply 1 drop to eye daily. ONE DROP IN AFFECTED EYE DAILY AS DIRECTED  . nitroGLYCERIN (NITROSTAT) 0.4 MG SL tablet Place 1 tablet (0.4 mg total) under the tongue every 5 (five) minutes x 3 doses as needed for chest pain.  . ticagrelor (BRILINTA) 60 MG TABS tablet Take 1 tablet (60 mg total) by mouth 2 (two) times daily.  Marland Kitchen triamcinolone (NASACORT) 55 MCG/ACT AERO nasal inhaler Place 2 sprays into the nose daily.     Allergies:   Ace inhibitors   Social History   Tobacco Use  . Smoking status: Former Smoker    Packs/day: 1.50    Years: 25.00    Pack years: 37.50    Types: Cigarettes    Quit date: 01/26/2016    Years since quitting: 3.3  . Smokeless tobacco: Never Used  .  Tobacco comment: in prison x 4 months, currently not smoking.  Substance Use Topics  . Alcohol use: Yes    Alcohol/week: 14.0 standard drinks    Types: 14 Cans of beer per week    Comment: in prison x 4 months, currently not drinking.  . Drug use: No     Family Hx: The patient's family history includes Diabetes in his father.   Prior CV studies:   The following studies were reviewed today: . No new studies:  Labs/Other Tests and Data Reviewed:    EKG:  No ECG reviewed.  Recent Labs: 09/07/2018: BUN 19; Creatinine, Ser 1.14; Potassium 4.1; Sodium 138   Recent Lipid Panel - due for labs Lab Results  Component Value Date/Time   CHOL 156 05/29/2016 02:41 AM   TRIG 163 (H) 05/29/2016 02:41 AM   HDL 28 (L) 05/29/2016 02:41 AM   CHOLHDL 5.6 05/29/2016 02:41 AM   LDLCALC 95 05/29/2016 02:41 AM    Wt Readings from Last 3 Encounters:  06/16/19 206 lb (93.4 kg)  01/24/19 203 lb (92.1 kg)  11/05/18 207 lb (93.9 kg)     Objective:    Vital Signs:  BP 128/83   Pulse 79   Temp (!) 97.2 F (36.2 C)   Resp 18   Ht  (1.651 m)   Wt 206 lb (93.4 kg)   SpO2 95%   BMI 34.28 kg/m   VITAL SIGNS:  reviewed Well nourished, well developed male in noacute distress. A&O x 3.  Normal Mood & Affect Non-labored respirations   ASSESSMENT & PLAN:    Problem List Items Addressed This Visit    CAD S/P PCI to coDom Cx & mRCA with DES (Chronic)    History of two-vessel DES PCI in September 2017--now on maintenance dose Brilinta 60 mg twice daily for secondary prevention.  Until he is out of jail, I do not think we will make any adjustments and just will continue with Brilinta.  FOR PREOPERATIVE EVALUATION QUESTIONS: Okay to hold Brilinta 7 days preop for procedures or surgeries.---Restart 1-2 days post procedure (or when deemed safe by surgeon)      Relevant Orders   Lipid panel   Comprehensive metabolic panel   Coronary artery disease involving native coronary artery of native  heart with angina pectoris (HCC) - Primary (Chronic)    Multivessel disease with occluded circumflex and then 99% RCA  in the setting of inferior STEMI back in 2017.  Now 3 years out from his MI. Angina well controlled with amlodipine and Lopressor.   On ARB for afterload reduction.  Now on maintenance dose Brilinta without aspirin.      Relevant Orders   Lipid panel   Comprehensive metabolic panel   Essential hypertension (Chronic)    Blood pressures well controlled on metoprolol tartrate, amlodipine and losartan.  Uses PRN Lasix for mild edema  No change      Hyperlipidemia with target LDL less than 70 (Chronic)    I do not have labs recently.  Need to have labs drawn at the facility infirmary. We actually had him hold his statin for myalgias.  Depending on those results, we may need to adjust and start a different statin or other medication.      Relevant Orders   Lipid panel   Comprehensive metabolic panel   Peripheral artery disease (HCC) (Chronic)    Moderate SFA disease and bilateral ATA occlusion.  I think most of his leg pain is probably more related to myalgias from statin than claudication at this point.  May be having some GI side effects from Pletal. Plan will be to reduce hold Pletal for 1 week--then if leg discomfort is better or does not change, stop.  If leg pain comes back, reduce to half tab twice daily.  Is otherwise on good cardiovascular medications including maintenance dose Brilinta, beta-blocker, calcium channel blocker and ARB.  Addressing lipids      Tobacco abuse   Myalgia due to statin    Symptoms notably improved since stopping statin.  I think this may be more of the issue than claudication.  Continue holding statin until we see lipid results.  May need to consider a different agent (would be difficult while he is incarcerated to consider PCSK9 inhibitor).  Would potentially try pravastatin or rosuvastatin.      Relevant Orders   Lipid  panel   Comprehensive metabolic panel      COVID-19 Education: The signs and symptoms of COVID-19 were discussed with the patient and how to seek care for testing (follow up with PCP or arrange E-visit).   The importance of social distancing was discussed today.  Time:   Today, I have spent 20 minutes with the patient with telehealth technology discussing the above problems.    Patient Instructions  Medication Instructions:  For the GI upset:  Hold cilostazol/Pletal for 1 week (do not take for 1 week) -->   after 1 week, if the leg discomfort with walking has not changed then simply stop  If leg pain comes back, then reduce dose to 1/2 tablet (50 mg) twice a day  Continue to hold the cholesterol medicine, but we may need to start a new medicine for cholesterol pending the results of your lab work.  If you need a refill on your cardiac medications before your next appointment, please call your pharmacy.   Lab work:  Will need to have the Infirmary Nurse check fasting blood work and send [place] the results;  Fasting lipid panel, complete metabolic panel --PLEASE FAX RESULTS TO [phone]   OFFICE  [address] [place], [place] [address]    Testing/Procedures:  NOT NEEDED  Follow-Up: At [facility], you and your health needs are our priority.  As part of our continuing mission to provide you with exceptional heart care, we have created designated Provider Care Teams.  These Care  Teams include your primary Cardiologist (physician) and Advanced Practice Providers (APPs -  Physician Assistants and Nurse Practitioners) who all work together to provide you with the care you need, when you need it. . F/u appt in 6 months ( march  2021)  with Corine ShelterLuke Kilroy, PA in office  .  Marland Kitchen. You will need a follow up appointment with Dr. Herbie BaltimoreHarding in  12  Months - SEPT 2021.  Please call our office 2 months in advance to schedule this appointment.    Any Other Special Instructions Will Be  Listed Below (If Applicable).  Keep exercising.      Signed, Bryan Lemmaavid Harding, MD  06/17/2019 4:28 PM    [facility]

## 2019-06-17 ENCOUNTER — Encounter: Payer: Self-pay | Admitting: Cardiology

## 2019-06-17 NOTE — Assessment & Plan Note (Signed)
I do not have labs recently.  Need to have labs drawn at the facility infirmary. We actually had him hold his statin for myalgias.  Depending on those results, we may need to adjust and start a different statin or other medication.

## 2019-06-17 NOTE — Assessment & Plan Note (Addendum)
History of two-vessel DES PCI in September 2017--now on maintenance dose Brilinta 60 mg twice daily for secondary prevention.  Until he is out of jail, I do not think we will make any adjustments and just will continue with Brilinta.  FOR PREOPERATIVE EVALUATION QUESTIONS: Okay to hold Brilinta 7 days preop for procedures or surgeries.---Restart 1-2 days post procedure (or when deemed safe by surgeon)

## 2019-06-17 NOTE — Assessment & Plan Note (Signed)
Symptoms notably improved since stopping statin.  I think this may be more of the issue than claudication.  Continue holding statin until we see lipid results.  May need to consider a different agent (would be difficult while he is incarcerated to consider PCSK9 inhibitor).  Would potentially try pravastatin or rosuvastatin.

## 2019-06-17 NOTE — Assessment & Plan Note (Signed)
Moderate SFA disease and bilateral ATA occlusion.  I think most of his leg pain is probably more related to myalgias from statin than claudication at this point.  May be having some GI side effects from Pletal. Plan will be to reduce hold Pletal for 1 week--then if leg discomfort is better or does not change, stop.  If leg pain comes back, reduce to half tab twice daily.  Is otherwise on good cardiovascular medications including maintenance dose Brilinta, beta-blocker, calcium channel blocker and ARB.  Addressing lipids

## 2019-06-17 NOTE — Assessment & Plan Note (Signed)
Multivessel disease with occluded circumflex and then 99% RCA in the setting of inferior STEMI back in 2017.  Now 3 years out from his MI. Angina well controlled with amlodipine and Lopressor.   On ARB for afterload reduction.  Now on maintenance dose Brilinta without aspirin.

## 2019-06-17 NOTE — Assessment & Plan Note (Signed)
Blood pressures well controlled on metoprolol tartrate, amlodipine and losartan.  Uses PRN Lasix for mild edema  No change

## 2019-12-14 ENCOUNTER — Encounter: Payer: Self-pay | Admitting: Cardiology

## 2019-12-14 ENCOUNTER — Ambulatory Visit (INDEPENDENT_AMBULATORY_CARE_PROVIDER_SITE_OTHER): Admitting: Cardiology

## 2019-12-14 ENCOUNTER — Other Ambulatory Visit: Payer: Self-pay

## 2019-12-14 VITALS — BP 138/94 | HR 82 | Ht 65.0 in | Wt 205.2 lb

## 2019-12-14 DIAGNOSIS — Z9861 Coronary angioplasty status: Secondary | ICD-10-CM

## 2019-12-14 DIAGNOSIS — Z789 Other specified health status: Secondary | ICD-10-CM

## 2019-12-14 DIAGNOSIS — I252 Old myocardial infarction: Secondary | ICD-10-CM | POA: Diagnosis not present

## 2019-12-14 DIAGNOSIS — I2510 Atherosclerotic heart disease of native coronary artery without angina pectoris: Secondary | ICD-10-CM | POA: Diagnosis not present

## 2019-12-14 DIAGNOSIS — E785 Hyperlipidemia, unspecified: Secondary | ICD-10-CM

## 2019-12-14 DIAGNOSIS — I739 Peripheral vascular disease, unspecified: Secondary | ICD-10-CM

## 2019-12-14 DIAGNOSIS — I10 Essential (primary) hypertension: Secondary | ICD-10-CM

## 2019-12-14 MED ORDER — ROSUVASTATIN CALCIUM 20 MG PO TABS: 20.0000 mg | ORAL_TABLET | Freq: Every day | ORAL | 3 refills | Status: DC

## 2019-12-14 MED ORDER — ROSUVASTATIN CALCIUM 10 MG PO TABS: 10.0000 mg | ORAL_TABLET | Freq: Every day | ORAL | 3 refills | Status: DC

## 2019-12-14 NOTE — Assessment & Plan Note (Signed)
[employer]

## 2019-12-14 NOTE — Assessment & Plan Note (Signed)
STEMI 9/17 with shock- RCA and PCI PCI/DES

## 2019-12-14 NOTE — Assessment & Plan Note (Signed)
Statin holiday April 2020 for myalgia LDL 162 Oct 2020 off statin Rx- LDL now down to 80 on Crestor 20 mg

## 2019-12-14 NOTE — Assessment & Plan Note (Signed)
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

## 2019-12-14 NOTE — Progress Notes (Signed)
Cardiology Office Note:    Date:  12/14/2019   ID:  Brian Harvey, DOB Feb 15, 1971, MRN [id number]  PCP:  System, Pcp Not In  Cardiologist:  [name], MD  Electrophysiologist:  None   Referring MD: No ref. provider found   No chief complaint on file.   History of Present Illness:    Brian Harvey is a 49 y.o. male with a hx of an NSTEMI in September 2017.  This was complicated by cardiogenic shock.  He underwent RCA and circumflex PCI with DES.  The patient has been incarcerated.  He is currently at [facility].  He has had issues with dyslipidemia and hypertension.  He had some problems with leg pain on statin therapy and this was discontinued with improvement in his symptoms. In Oct 2020 his LDL was 163-off statin Rx.  At some point he was apparently place of Rosuvastatin 20mg .  Recent lipids showed an LDL of 80 and he seems to be tolerating this. He did complain of occasional chest "pressure" though this was not exertional and not similar to his pre PCI symptoms.  He does complain of SOB at time- it seems to be at night -? secondary to Brilinta.    Past Medical History:  Diagnosis Date  . Alcohol abuse   . CAD S/P percutaneous coronary angioplasty 05/2016   a. 05/2016 Acute inferior STEMI/PCI: LM nl, pLAD 40%, o-p D2 80% (med Rx), small RI- lat branch 99% (med Rx).  Culprit: mLCX 100%-> (2.75x16 Promus Premier DES - 3.0), o-p RCA 30%, mRCA 99% -> (2.5x16 Promus Premier DES - 2.5), EF 55-65%, Mod MR, High LVEDP  . History of pancreatitis (in setting of alcoholism)   . Hypertensive heart disease    a. 05/2016 Echo: EF 55-60%, no rwma, Gr1 DD, mild MR.  . Peripheral artery disease (HCC) 09/2018   Abnormal TBI's despite relatively normal ABIs (R ABI 0.96, L ABI 0.92).  ->  Abdominal iliac-lower extremity CTA: No significant aortoiliac flow.  Noncritical proximal L Renal A stenosis (~50%). RSFA -few distal lesions ~50%. RATA 100% (poor reconsititution), 2 V runoff (RPTA dominant).   LSFA - distal ~50-60% stenosis. LATA 100% in mid calf w/ reconstitution. 2 V runoff (LPTA dominant)  . ST elevation myocardial infarction (STEMI) of inferolateral wall (HCC) 05/2016   Complicated by Cardiogenic Shock --> 100-% Cx & 99% RCA --> 2V PCI  . Tobacco abuse     Past Surgical History:  Procedure Laterality Date  . CARDIAC CATHETERIZATION N/A 05/28/2016   Procedure: Left Heart Cath and Coronary Angiography;  Surgeon: Peter M 07/28/2016, MD;  Location: [facility] INVASIVE CV LAB;  Service: Cardiovascular:  LM nl, pLAD 40%, o-p D2 80% (med Rx), small RI- lat branch 99% (med Rx).  Culprit: mLCX 100%-> PCI. O-p RCA 30%, mRCA 99% -> PCI. EF 55-65%, Mod MR, High LVEDP  . CARDIAC CATHETERIZATION N/A 05/28/2016   Procedure: Coronary Stent Intervention;  Surgeon: Peter M 07/28/2016, MD;  Location: [facility] INVASIVE CV LAB;  Service: Cardiovascular: Culprit: mLCX 100%-> (2.75x16 Promus Premier DES - 3.0); mRCA 99% -> (2.5x16 Promus Premier DES - 2.5)  . TRANSTHORACIC ECHOCARDIOGRAM  05/29/2016   EF 55-60%. Moderate-severe LVH with severe posterior wall hypertrophy. GR 1 DD with high filling pressures. Mild MR    Current Medications: Current Meds  Medication Sig  . acetaminophen (TYLENOL) 650 MG CR tablet Take 1 tablet (650 mg total) 2 (two) times daily as needed by mouth for pain (FOR HEADACHES AND MUSCLE  PAIN).  . amLODipine (NORVASC) 10 MG tablet Take 1 tablet (10 mg total) by mouth daily.  . cilostazol (PLETAL) 100 MG tablet Take 100 mg by mouth 2 (two) times daily.  . furosemide (LASIX) 20 MG tablet Take 20 mg by mouth every Monday, Wednesday, and Friday.  . hydrochlorothiazide (HYDRODIURIL) 25 MG tablet Take 1 tablet (25 mg total) by mouth daily.  Marland Kitchen loratadine (CLARITIN) 10 MG tablet Take 10 mg by mouth daily.  Marland Kitchen losartan (COZAAR) 50 MG tablet Take 50 mg 2 (two) times daily by mouth.  . Multiple Vitamins-Iron (MULTIVITAMINS WITH IRON) TABS tablet Take 1 tablet daily by mouth.  . Naphazoline-Pheniramine  0.027-0.315 % SOLN Apply 1 drop to eye daily. ONE DROP IN AFFECTED EYE DAILY AS DIRECTED  . nitroGLYCERIN (NITROSTAT) 0.4 MG SL tablet Place 1 tablet (0.4 mg total) under the tongue every 5 (five) minutes x 3 doses as needed for chest pain.  . ticagrelor (BRILINTA) 60 MG TABS tablet Take 1 tablet (60 mg total) by mouth 2 (two) times daily.  Marland Kitchen triamcinolone (NASACORT) 55 MCG/ACT AERO nasal inhaler Place 2 sprays into the nose daily.     Allergies:   Ace inhibitors   Social History   Socioeconomic History  . Marital status: Single    Spouse name: Not on file  . Number of children: Not on file  . Years of education: Not on file  . Highest education level: Not on file  Occupational History  . Not on file  Tobacco Use  . Smoking status: Former Smoker    Packs/day: 1.50    Years: 25.00    Pack years: 37.50    Types: Cigarettes    Quit date: 01/26/2016    Years since quitting: 3.8  . Smokeless tobacco: Never Used  . Tobacco comment: in prison x 4 months, currently not smoking.  Substance and Sexual Activity  . Alcohol use: Yes    Alcohol/week: 14.0 standard drinks    Types: 14 Cans of beer per week    Comment: in prison x 4 months, currently not drinking.  . Drug use: No  . Sexual activity: Not on file  Other Topics Concern  . Not on file  Social History Narrative   Recently transferred to [facility] -hopes to be released from prison the end of 2020.   When not in prison -his home of record is in [place].   Social Determinants of Health   Financial Resource Strain:   . Difficulty of Paying Living Expenses:   Food Insecurity:   . Worried About Programme researcher, broadcasting/film/video in the Last Year:   . Barista in the Last Year:   Transportation Needs:   . Freight forwarder (Medical):   Marland Kitchen Lack of Transportation (Non-Medical):   Physical Activity:   . Days of Exercise per Week:   . Minutes of Exercise per Session:   Stress:   . Feeling of Stress :   Social  Connections:   . Frequency of Communication with Friends and Family:   . Frequency of Social Gatherings with Friends and Family:   . Attends Religious Services:   . Active Member of Clubs or Organizations:   . Attends Banker Meetings:   Marland Kitchen Marital Status:      Family History: The patient's family history includes Diabetes in his father.  ROS:   Please see the history of present illness.     All other systems reviewed and are  negative.  EKGs/Labs/Other Studies Reviewed:    The following studies were reviewed today: Lab work from [facility] done in Oct 2020 will be scanned into his chart.   EKG:  EKG is ordered today.  The ekg ordered today demonstrates NSR,  LAD, HR 82  Recent Labs: No results found for requested labs within last 8760 hours.  Recent Lipid Panel    Component Value Date/Time   CHOL 156 05/29/2016 0241   TRIG 163 (H) 05/29/2016 0241   HDL 28 (L) 05/29/2016 0241   CHOLHDL 5.6 05/29/2016 0241   VLDL 33 05/29/2016 0241   LDLCALC 95 05/29/2016 0241    Physical Exam:    VS:  BP (!) 138/94   Pulse 82   Ht 5\' 5"  (1.651 m)   Wt 205 lb 3.2 oz (93.1 kg)   SpO2 98%   BMI 34.15 kg/m     Wt Readings from Last 3 Encounters:  12/14/19 205 lb 3.2 oz (93.1 kg)  06/16/19 206 lb (93.4 kg)  01/24/19 203 lb (92.1 kg)     GEN:  Well nourished, well developed in no acute distress HEENT: Normal NECK: No JVD; No carotid bruits CARDIAC: RRR, no murmurs, rubs, gallops RESPIRATORY:  Clear to auscultation without rales, wheezing or rhonchi  ABDOMEN: Soft, non-tender, non-distended MUSCULOSKELETAL:  No edema; No deformity  SKIN: Warm and dry NEUROLOGIC:  Alert and oriented x 3 PSYCHIATRIC:  Normal affect   ASSESSMENT:    CAD S/P PCI   05/2016 Acute inferolateral STEMI/PC Ito coDom Cx & mRCA with DES.  LM nl, LAD 40p, D2 80ost/prox (med Rx), RI small, 99 in lat branch, LCX 100 (3.75x16 Promus Premier DES w/ PTCA into OM), RCA 30ost/prox, 78m  (2.5x16 Promus Premier DES), EF 55-65%.  Essential hypertension Fair control  Hyperlipidemia with target LDL less than 70 Statin holiday April 2020 for myalgia LDL 162 Oct 2020 off statin Rx- LDL now down to 80 on Crestor 20 mg  Peripheral artery disease (HCC) Bilateral tibial occlusion by CTA 10/01/2018-2V runoff  History of incarceration [facility]  History of ST elevation myocardial infarction (STEMI) STEMI 9/17 with shock- RCA and PCI PCI/DES  CRI-  SCr 1.34 in Oct 2020  PLAN:    I don't think his chest discomfort is secondary to angina.  We discussed typical anginal symptoms and he agrees.  He will let Nov 2020 know if he develops any exertional chest pain.  His SOB is not exertional and I wonder if its secondary to Brilinta.  I'll ask Dr [place] about switching him to Plavix.   Medication Adjustments/Labs and Tests Ordered: Current medicines are reviewed at length with the patient today.  Concerns regarding medicines are outlined above.  Orders Placed This Encounter  Procedures  . EKG 12-Lead   Meds ordered this encounter  Medications  . DISCONTD: rosuvastatin (CRESTOR) 10 MG tablet    Sig: Take 1 tablet (10 mg total) by mouth daily.    Dispense:  90 tablet    Refill:  3  . DISCONTD: rosuvastatin (CRESTOR) 20 MG tablet    Sig: Take 1 tablet (20 mg total) by mouth daily.    Dispense:  90 tablet    Refill:  3    Please do not fill 10 mg, patient should be on 20 mg  . rosuvastatin (CRESTOR) 20 MG tablet    Sig: Take 1 tablet (20 mg total) by mouth daily.    Dispense:  90 tablet    Refill:  3  Please do not fill 10 mg, patient should be on 20 mg    Patient Instructions  Medication Instructions:   1) Continue Rosuvastatin 20 mg, 1 tablet by mouth once a day  *If you need a refill on your cardiac medications before your next appointment, please call your pharmacy*  Lab Work:  None ordered today  If you have labs (blood work) drawn today and your tests are  completely normal, you will receive your results only by: Marland Kitchen MyChart Message (if you have MyChart) OR . A paper copy in the mail If you have any lab test that is abnormal or we need to change your treatment, we will call you to review the results.  Testing/Procedures:  None ordered today  Follow-Up: At [facility], you and your health needs are our priority.  As part of our continuing mission to provide you with exceptional heart care, we have created designated Provider Care Teams.  These Care Teams include your primary Cardiologist (physician) and Advanced Practice Providers (APPs -  Physician Assistants and Nurse Practitioners) who all work together to provide you with the care you need, when you need it.  We recommend signing up for the patient portal called "MyChart".  Sign up information is provided on this After Visit Summary.  MyChart is used to connect with patients for Virtual Visits (Telemedicine).  Patients are able to view lab/test results, encounter notes, upcoming appointments, etc.  Non-urgent messages can be sent to your provider as well.   To learn more about what you can do with MyChart, go to [email or web address].    Your next appointment:   12 month(s)  The format for your next appointment:   In Person  Provider:   Glenetta Hew, MD      Signed, Kerin Ransom, PA-C  12/14/2019 2:36 PM    [place]

## 2019-12-14 NOTE — Patient Instructions (Addendum)
Medication Instructions:   1) Continue Rosuvastatin 20 mg, 1 tablet by mouth once a day  *If you need a refill on your cardiac medications before your next appointment, please call your pharmacy*  Lab Work:  None ordered today  If you have labs (blood work) drawn today and your tests are completely normal, you will receive your results only by: Marland Kitchen MyChart Message (if you have MyChart) OR . A paper copy in the mail If you have any lab test that is abnormal or we need to change your treatment, we will call you to review the results.  Testing/Procedures:  None ordered today  Follow-Up: At [facility], you and your health needs are our priority.  As part of our continuing mission to provide you with exceptional heart care, we have created designated Provider Care Teams.  These Care Teams include your primary Cardiologist (physician) and Advanced Practice Providers (APPs -  Physician Assistants and Nurse Practitioners) who all work together to provide you with the care you need, when you need it.  We recommend signing up for the patient portal called "MyChart".  Sign up information is provided on this After Visit Summary.  MyChart is used to connect with patients for Virtual Visits (Telemedicine).  Patients are able to view lab/test results, encounter notes, upcoming appointments, etc.  Non-urgent messages can be sent to your provider as well.   To learn more about what you can do with MyChart, go to [email or web address].    Your next appointment:   12 month(s)  The format for your next appointment:   In Person  Provider:   [name], MD

## 2019-12-14 NOTE — Assessment & Plan Note (Signed)
Fair control.

## 2019-12-14 NOTE — Assessment & Plan Note (Signed)
   05/2016 Acute inferolateral STEMI/PC Ito coDom Cx & mRCA with DES.  LM nl, LAD 40p, D2 80ost/prox (med Rx), RI small, 99 in lat branch, LCX 100 (3.75x16 Promus Premier DES w/ PTCA into OM), RCA 30ost/prox, 1m (2.5x16 Promus Premier DES), EF 55-65%.

## 2019-12-16 NOTE — Progress Notes (Signed)
Sure - OK to change.  [name], MD

## 2019-12-27 ENCOUNTER — Telehealth: Payer: Self-pay | Admitting: *Deleted

## 2019-12-27 NOTE — Telephone Encounter (Signed)
Attempt to call [facility] message for medical department to call back.  --No loading dose needed per Dr. Herbie Baltimore

## 2019-12-27 NOTE — Telephone Encounter (Signed)
-----   Message from Abelino Derrick, [place] sent at 12/16/2019  4:36 PM EDT ----- Please change this patient from Brilinta to Plavix 75 mg daily. (Discussed with Dr Herbie Baltimore)  Corine Shelter PA-C 12/16/2019 4:37 PM

## 2019-12-29 NOTE — Telephone Encounter (Signed)
Left message with medical department to call back

## 2020-01-19 NOTE — Telephone Encounter (Signed)
Left message for medical department to call back

## 2020-02-07 MED ORDER — CLOPIDOGREL BISULFATE 75 MG PO TABS
75.0000 mg | ORAL_TABLET | Freq: Every day | ORAL | 3 refills | Status: AC
Start: 2020-02-07 — End: ?

## 2020-02-07 NOTE — Telephone Encounter (Addendum)
Spoke to medical department at [facility]-requested order faxed to [phone], attn: Medical   Order faxed.

## 2020-02-07 NOTE — Addendum Note (Signed)
Addended by: Johney Frame A on: 02/07/2020 09:22 AM   Modules accepted: Orders

## 2020-03-30 ENCOUNTER — Telehealth: Payer: Self-pay | Admitting: Cardiology

## 2020-03-30 NOTE — Telephone Encounter (Signed)
New Message  Patient c/o Palpitations:  High priority if patient c/o lightheadedness, shortness of breath, or chest pain  1) How long have you had palpitations/irregular HR/ Afib? Are you having the symptoms now? Yes, 2 weeks  2) Are you currently experiencing lightheadedness, SOB or CP? Palpatations, SOB, CP  3) Do you have a history of afib (atrial fibrillation) or irregular heart rhythm? Yes  4) Have you checked your BP or HR? (document readings if available): 131/91 HR 66  5) Are you experiencing any other symptoms? Palpatations, SOB, CP (has been using nitroglycerin every night due to CP)

## 2020-03-30 NOTE — Telephone Encounter (Signed)
Spoke with Rose from [facility] who report Dr. Blima Dessert noted that on 6/30 pt reported SOB and an uncomfortable feeling in his chest. He state pt report when laying on left side he feels pressure, and when laying on right side, it exasperate his tachycardia.  On 7/2, Dr. Blima Dessert documented pt denies CP, dizziness, and SOB.  He also report EKG show normal sinus with occasional PVC's, HR 80.    Will route to MD for recommendations  Return call to [facility] at [phone] ext [phone]

## 2020-04-01 NOTE — Telephone Encounter (Signed)
We are attempting to get him scheduled to come in to be seen.  Would prefer to have an in person visit.  Likely consider an event monitor (Zio patch), and may be or may be not a stress test depending on the type of symptoms.  I have not seen him in a while.  [name], MD

## 2020-04-02 NOTE — Telephone Encounter (Signed)
Called  Transferred to the extension - no answer  phone rang x 10  Will try again

## 2020-04-02 NOTE — Telephone Encounter (Signed)
CALLED SPOKE TO ROSE . - INFORMED  HER THAT dR HARDING WOULD LIKE TO SEE MR Campo IN THE OFFICE.   APPOINTMENT AVAILABLE  04/04/20  PER ROSE , IF PATIENT NEEDS A MONITOR - IT WOULD BEST TO HAVE MONITOR  PLACED THE DAY HE WAS PRESENT IN THE OFFICE   ( will attempt to arrange for monitor)       CONTACTED TRANSPORTATION WITH  [facility] - [phone] [phone]  LISA BERNARD.  MS. BERNARD CONFIRMED SHE WILL have TRANSPORTATION FOR PATIENT Brian Harvey . Appt 1:40 pm on 04/05/20.

## 2020-04-02 NOTE — Telephone Encounter (Signed)
RN spoke to [employer] street office - monitor are available --  Will contact 04/03/20

## 2020-04-04 ENCOUNTER — Other Ambulatory Visit: Payer: Self-pay

## 2020-04-04 ENCOUNTER — Encounter: Payer: Self-pay | Admitting: Cardiology

## 2020-04-04 ENCOUNTER — Ambulatory Visit (INDEPENDENT_AMBULATORY_CARE_PROVIDER_SITE_OTHER): Admitting: Cardiology

## 2020-04-04 VITALS — BP 138/84 | HR 78 | Ht 65.0 in | Wt 210.8 lb

## 2020-04-04 DIAGNOSIS — R079 Chest pain, unspecified: Secondary | ICD-10-CM | POA: Insufficient documentation

## 2020-04-04 DIAGNOSIS — I10 Essential (primary) hypertension: Secondary | ICD-10-CM

## 2020-04-04 DIAGNOSIS — I25119 Atherosclerotic heart disease of native coronary artery with unspecified angina pectoris: Secondary | ICD-10-CM | POA: Diagnosis not present

## 2020-04-04 DIAGNOSIS — I2510 Atherosclerotic heart disease of native coronary artery without angina pectoris: Secondary | ICD-10-CM | POA: Diagnosis not present

## 2020-04-04 DIAGNOSIS — I252 Old myocardial infarction: Secondary | ICD-10-CM

## 2020-04-04 DIAGNOSIS — R002 Palpitations: Secondary | ICD-10-CM

## 2020-04-04 DIAGNOSIS — Z9861 Coronary angioplasty status: Secondary | ICD-10-CM

## 2020-04-04 DIAGNOSIS — E785 Hyperlipidemia, unspecified: Secondary | ICD-10-CM

## 2020-04-04 MED ORDER — ISOSORBIDE MONONITRATE ER 30 MG PO TB24: 30.0000 mg | ORAL_TABLET | Freq: Every day | ORAL | 3 refills | Status: AC

## 2020-04-04 MED ORDER — ROSUVASTATIN CALCIUM 40 MG PO TABS: 40.0000 mg | ORAL_TABLET | Freq: Every day | ORAL | 3 refills | Status: AC

## 2020-04-04 NOTE — Progress Notes (Signed)
Primary Care Provider: System, Pcp Not In Cardiologist: Bryan Lemmaavid Lorrie Strauch, MD Electrophysiologist: None  Clinic Note: Chief Complaint  Patient presents with   Follow-up    From facility clinic   Palpitations   Chest Pain    Chest just does not feel right    HPI:    Brian Harvey is a 49 y.o. male with a PMH below who presents today for evaluation of palpitations and chest discomfort.  Recent Hospitalizations: None  I last saw him via telemedicine on June 16, 2019 (had [facility] [place] [facility]this was a follow-up episodes of leg discomfort having stopped statin.  Had been seen by Dr. Kirke CorinArida for possible claudication and was found to have two-vessel outflow bilaterally.  His leg discomfort did get better after holding statin.  Was taking Lasix 3 days a week and swelling was better. --> We had just increased his Norvasc to 10 mg. --> Lipid panel ordered along with CMP  He was seen by Hulan SaasJoseph Umesi, MD at [facility] on March 23, 2020 with multiple complaints.  Complains of skipping and "stopping of heartbeat intermittently--has jumped off his bed because of a sensation of "stopping of his heart.  Also occasionally notes increases in heart rate that are associated with dyspnea.  Lying on his right side tends to exacerbate the tachycardia and lying on his left side gives him a sensation of pulling or pressure on his heart.  He sometimes has taken nitroglycerin for this and has helped.  EKG showed sinus rhythm with occasional PVCs.  Rate 80 bpm.  At the visit itself, he denied any chest pain or dyspnea or dizziness.  Has not not been exercising routinely--ind[place].  Reviewed  CV studies:    The following studies were reviewed today: (if available, images/films reviewed: From Epic Chart or Care Everywhere)  No recent studies:   Interval History:    Brian Harvey is here today after he was [facility].  He pretty much reiterates the same symptoms as noted above.  It is very interesting for the last just about a month he just has not really been feeling right.  The symptoms have come on he said sometimes is helped with antacids sometimes it helps when he takes nitroglycerin.  The tachycardia spells can last anywhere from 20 to 15minutes it tends and decrease when he gets up and walks and also gets a drink.  When I asked him if he had any change in symptoms when we switched from Brilinta to Plavix, he really did not notice any change in dyspnea.  It does not sound like he has routinely exertional dyspnea or chest discomfort, but he really does not do much of anything to test this theory.  He has no PND orthopnea, but definitely has a strange sensations when he lies on one side versus the other..  CV Review of Symptoms (Summary) Cardiovascular ROS: positive for - irregular heartbeat, palpitations, shortness of breath and Atypical sounding chest discomfort as noted; heart rate changes depending on what side he lays on. negative for - dyspnea on exertion, edema, orthopnea, paroxysmal nocturnal dyspnea, shortness of breath or Syncope/near syncope or TIA/amaurosis fugax, claudication  The patient  have symptoms concerning for COVID-19 infection (fever, chills, cough, or new shortness of breath).  The patient is practicing social distancing & Masking. -->  Abiding by prison rules   REVIEWED OF SYSTEMS    Review of Systems  Constitutional: Positive for malaise/fatigue. Negative for weight loss.  HENT: Negative for nosebleeds.   Respiratory: Negative for shortness of breath.   Cardiovascular: Positive for chest pain (Says his chest feels uncomfortable) and leg swelling.  Gastrointestinal: Positive for constipation (Not really constipated, just has to strain sometimes.). Negative for abdominal pain, blood in stool and  melena.       Overall poor appetite.  Just not feeling well over the last month or so  Genitourinary: Negative for hematuria.  Musculoskeletal: Positive for joint pain and myalgias. Negative for back pain, falls and neck pain.  Neurological: Positive for dizziness (Sometimes but relatively rare). Negative for focal weakness and seizures.  Psychiatric/Behavioral: Positive for depression. The patient has insomnia (Some nights). The patient is not nervous/anxious.    I have reviewed and (if needed) personally updated the patient's problem list, medications, allergies, past medical and surgical history, social and family history.   PAST MEDICAL HISTORY   Past Medical History:  Diagnosis Date   Alcohol abuse    CAD S/P percutaneous coronary angioplasty 05/2016   a. 05/2016 Acute inferior STEMI/PCI: LM nl, pLAD 40%, o-p D2 80% (med Rx), small RI- lat branch 99% (med Rx).  Culprit: mLCX 100%-> (2.75x16 Promus Premier DES - 3.0), o-p RCA 30%, mRCA 99% -> (2.5x16 Promus Premier DES - 2.5), EF 55-65%, Mod MR, High LVEDP   History of pancreatitis (in setting of alcoholism)    Hypertensive heart disease    a. 05/2016 Echo: EF 55-60%, no rwma, Gr1 DD, mild MR.   Peripheral artery disease (HCC) 09/2018   Abnormal TBI's despite relatively normal ABIs (R ABI 0.96, L ABI 0.92).  ->  Abdominal iliac-lower extremity CTA: No significant aortoiliac flow.  Noncritical proximal L Renal A stenosis (~50%). RSFA -few distal lesions ~50%. RATA 100% (poor reconsititution), 2 V runoff (RPTA dominant).  LSFA - distal ~50-60% stenosis. LATA 100% in mid calf w/ reconstitution. 2 V runoff (LPTA dominant)   ST elevation myocardial infarction (STEMI) of inferolateral wall (HCC) 05/2016   Complicated by Cardiogenic Shock --> 100-% Cx & 99% RCA --> 2V PCI   Tobacco abuse     PAST SURGICAL HISTORY   Past Surgical History:  Procedure Laterality Date   CARDIAC CATHETERIZATION N/A 05/28/2016   Procedure: Left Heart Cath  and Coronary Angiography;  Surgeon: Peter M Swaziland, MD;  [facility] CV LAB;  Service: Cardiovascular:  LM nl, pLAD 40%, o-p D2 80% (med Rx), small RI- lat branch 99% (med Rx).  Culprit: mLCX 100%-> PCI. O-p RCA 30%, mRCA 99% -> PCI. EF 55-65%, Mod MR, High LVEDP   CARDIAC CATHETERIZATION N/A 05/28/2016   Procedure: Coronary Stent Intervention;  Surgeon: Peter M Swaziland, MD;  Location: [facility] INVASIVE CV LAB;  Service: Cardiovascular: Culprit: mLCX 100%-> (2.75x16 Promus Premier DES - 3.0); mRCA 99% -> (2.5x16 Promus Premier DES - 2.5)   TRANSTHORACIC ECHOCARDIOGRAM  05/29/2016   EF 55-60%. Moderate-severe LVH with severe posterior wall hypertrophy. GR 1 DD with high filling pressures. Mild MR   September 2017 acute inferior STEMIPost-Intervention Diagram     MEDICATIONS/ALLERGIES   Current Meds  Medication Sig   acetaminophen (TYLENOL) 650 MG CR tablet Take 1 tablet (650 mg total) 2 (two) times daily as needed by mouth for pain (FOR HEADACHES AND MUSCLE PAIN).   amLODipine (NORVASC) 10 MG tablet Take 1 tablet (10 mg total) by mouth daily.  clopidogrel (PLAVIX) 75 MG tablet Take 1 tablet (75 mg total) by mouth daily.   furosemide (LASIX) 20 MG tablet Take 20 mg by mouth every Monday, Wednesday, and Friday.   hydrochlorothiazide (HYDRODIURIL) 25 MG tablet Take 1 tablet (25 mg total) by mouth daily.   losartan (COZAAR) 50 MG tablet Take 50 mg 2 (two) times daily by mouth.   metoprolol tartrate (LOPRESSOR) 50 MG tablet Take 1 tablet (50 mg total) by mouth 2 (two) times daily.   Naphazoline-Pheniramine 0.027-0.315 % SOLN Apply 1 drop to eye daily. ONE DROP IN AFFECTED EYE DAILY AS DIRECTED   nitroGLYCERIN (NITROSTAT) 0.4 MG SL tablet Place 1 tablet (0.4 mg total) under the tongue every 5 (five) minutes x 3 doses as needed for chest pain.   [DISCONTINUED] cilostazol (PLETAL) 100 MG tablet Take 100 mg by mouth 2 (two) times daily.   [DISCONTINUED] Multiple Vitamins-Iron  (MULTIVITAMINS WITH IRON) TABS tablet Take 1 tablet daily by mouth.    Allergies  Allergen Reactions   Ace Inhibitors Cough    SOCIAL HISTORY/FAMILY HISTORY   Reviewed in Epic:  Pertinent findings: He Is Currently at [facility]  OBJCTIVE -PE, EKG, labs   Wt Readings from Last 3 Encounters:  04/04/20 210 lb 12.8 oz (95.6 kg)  12/14/19 205 lb 3.2 oz (93.1 kg)  06/16/19 206 lb (93.4 kg)    Physical Exam: BP 138/84    Pulse 78    Ht 5\' 5"  (1.651 m)    Wt 210 lb 12.8 oz (95.6 kg)    SpO2 99%    BMI 35.08 kg/m  Physical Exam Vitals reviewed.  Constitutional:      General: He is not in acute distress.    Appearance: Normal appearance. He is not ill-appearing.     Comments: Moderately obese with BMI of 35.  Well-groomed.  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  Neck:     Vascular: Normal carotid pulses. No carotid bruit, hepatojugular reflux or JVD.  Cardiovascular:     Rate and Rhythm: Normal rate and regular rhythm. Occasional extrasystoles are present.    Chest Wall: PMI is not displaced.     Pulses: Normal pulses.     Heart sounds: No friction rub. Gallop present. S4 sounds present.   Pulmonary:     Effort: Pulmonary effort is normal.     Breath sounds: Normal breath sounds.  Abdominal:     General: Abdomen is flat. Bowel sounds are normal. There is no distension.     Palpations: Abdomen is soft. There is no mass.     Comments: No HSM  Musculoskeletal:        General: Tenderness (Bilateral legs) present. No swelling. Normal range of motion.     Cervical back: Normal range of motion and neck supple.  Neurological:     General: No focal deficit present.     Mental Status: He is alert and oriented to person, place, and time.  Psychiatric:        Mood and Affect: Mood normal.        Thought Content: Thought content normal.        Judgment: Judgment normal.     Comments:  Somewhat reserved     Adult ECG Report  Rate: 78 ;  Rhythm: normal sinus rhythm, premature ventricular contractions (PVC) and Left atrial abnormality, nonspecific ST-T wave changes.;  Borderline left axis (-21 degrees).  Narrative  Interpretation: Relatively stable EKG.  Recent Labs: 01/05/2020  Na+ 140, K+ 5.1, Cl- 101, HCO3-24, BUN 10, Cr 1.28, Glu 96, Ca2+ 9.5;; 12/08/2019 AST 18, ALT 34, AlkP 75; Cr 1.34  TC 133, TG 78, HDL 37, LDL 81  Lab Results  Component Value Date   CHOL 156 05/29/2016   HDL 28 (L) 05/29/2016   LDLCALC 95 05/29/2016   TRIG 163 (H) 05/29/2016   CHOLHDL 5.6 05/29/2016   Lab Results  Component Value Date   CREATININE 1.14 09/07/2018   BUN 19 09/07/2018   NA 138 09/07/2018   K 4.1 09/07/2018   CL 101 09/07/2018   CO2 20 09/07/2018   Lab Results  Component Value Date   TSH 0.813 05/28/2016    ASSESSMENT/PLAN    Problem List Items Addressed This Visit    CAD S/P PCI  (Chronic)    PCI to dominant LCx and mid RCA back in 2017 with existing moderate LAD disease.  He is on beta-blocker, ARB and statin along with Plavix.  Plavix essentially maintenance.   Okay to hold Plavix 5 to 7 days for bleeding/bruising or procedures/surgeries.      Relevant Medications   isosorbide mononitrate (IMDUR) 30 MG 24 hr tablet   rosuvastatin (CRESTOR) 40 MG tablet   Coronary artery disease involving native coronary artery of native heart with angina pectoris (HCC) (Chronic)    He had multivessel CAD with an occluded circumflex and significant RCA disease back in the setting of inferior MI in 2017.  Had PCI to both vessels, and is now off by about 4 years out.  Has had intermittent chest discomfort episodes but is pretty much been well controlled amlodipine and Lopressor.  He is also on ARB for afterload reduction.  Currently noticing some atypical sounding chest discomfort spells.  I would like to see if this could potentially just all be related to some type of  arrhythmia, so we will check 7-day monitor first.  We will also restart Imdur 30 mg daily prophylactically.  If symptoms of chest discomfort continue, would probably consider Myoview stress test as his symptoms in the past have not been classically straightforward.      Relevant Medications   isosorbide mononitrate (IMDUR) 30 MG 24 hr tablet   rosuvastatin (CRESTOR) 40 MG tablet   Other Relevant Orders   EKG 12-Lead (Completed)   Essential hypertension (Chronic)    Borderline blood pressure control today.  He is on losartan and HCTZ along with metoprolol and amlodipine.  -> Pending monitor results, may want to titrate beta-blocker up to 75 or 100 mg twice daily      Relevant Medications   isosorbide mononitrate (IMDUR) 30 MG 24 hr tablet   rosuvastatin (CRESTOR) 40 MG tablet   Hyperlipidemia with target LDL less than 70 (Chronic)    Lipids look pretty good, we had done a statin holiday in the past.  LDL that was 81 I would like to be a little bit better than that. ->   Plan: Increase rosuvastatin to 40 mg daily  Will need follow-up labs in 3 to 4 months, this can be ordered after follow-up visit      Relevant Medications   isosorbide mononitrate (IMDUR) 30 MG 24 hr tablet   rosuvastatin (CRESTOR) 40 MG tablet   History of ST elevation myocardial infarction (STEMI)    He presented with pretty significant chest discomfort and was in shock.  My concern is that his symptoms were not classic in that  setting, but did have more of a profound chest discomfort with nausea and diaphoresis.  Has done relatively well after PCI.  He is having some unusual symptoms at this time and I would have a low threshold to consider stress test evaluation just to be sure.  I am concerned that symptoms and cries for help may not be fully understood in his current situation.      Palpitations - Primary    This really seems to be the major issue the fast heart rate spells and irregular heartbeats etc.  He  does have PVCs on EKG.  Need to confirm that the is not having SVT, VT or frequent bigeminy/trigeminy.  For now continue current dose of beta-blocker.  We will check 7-day monitor that will be ordered by the correctional system.  Results will be sent to me and will follow up.  Pending results, may titrate beta-blocker for      Relevant Medications   isosorbide mononitrate (IMDUR) 30 MG 24 hr tablet   Other Relevant Orders   EKG 12-Lead (Completed)   Chest pain of uncertain etiology    Chest discomfort is somewhat atypical.  Need to make sure is not simply because of palpitations.  Order monitor.  However on the off chance that it could be anginal in nature, I would add Imdur 30 mg daily.  He is already on amlodipine and beta-blocker.  Based on the fact that we are trying to see what is happening from a rhythm standpoint I do not want titrate beta-blocker at this time, but that we the next option.  Low threshold to consider ischemic evaluation with Myoview.      Relevant Medications   isosorbide mononitrate (IMDUR) 30 MG 24 hr tablet   rosuvastatin (CRESTOR) 40 MG tablet   Other Relevant Orders   EKG 12-Lead (Completed)       COVID-19 Education: The signs and symptoms of COVID-19 were discussed with the patient and how to seek care for testing (follow up with PCP or arrange E-visit).   The importance of social distancing and COVID-19 vaccination was discussed today.  I spent a total of 25 minutes with the patient. >  50% of the time was spent in direct patient consultation.  Additional time spent with chart review  / charting (studies, outside notes, etc): 10 Total Time: 35 min   Current medicines are reviewed at length with the patient today.  (+/- concerns) n/a  Notice: This dictation was prepared with Dragon dictation along with smaller phrase technology. Any transcriptional errors that result from this process are unintentional and may not be corrected upon review.  OUTSIDE  MEDICAL APPOINTMENT BILLING INFORMATION Authorization #[id number] --> medical appointment, [facility] [address], [address], [place] Sending facility: [facility] ([id number])  Instructions For Health Care Provider Electronic claim information: [email or web address] Submitter ID: [id number] Destination Entity Federal TIN: [id number] Authorization number: [id number] [facility] Number: [id number]   Patient Instructions / Medication Changes & Studies & Tests Ordered   Patient Instructions  Medication Instructions:    CHANGE ROSUVASTATIN  TO 40 MG DAILY  START TAKING IMDUR 30 MG ONE TABLET DAILY '  *If you need a refill on your cardiac medications before your next appointment, please call your pharmacy*   Lab Work: NOT NEEDED   Testing/Procedures:  RECOMMEND  A 7 DAY EVENT MONITOR - PLEASE FAX RESULTS OF MONITOR TO DR Talin Feister  DX PALPATIONS    Follow-Up: At [facility], you and your health needs  are our priority.  As part of our continuing mission to provide you with exceptional heart care, we have created designated Provider Care Teams.  These Care Teams include your primary Cardiologist (physician) and Advanced Practice Providers (APPs -  Physician Assistants and Nurse Practitioners) who all work together to provide you with the care you need, when you need it.    Your next appointment:   7 week(s)  The format for your next appointment:   Either In Person or Virtual  Provider:   [name], MD   Other Instructions   Studies Ordered:   Orders Placed This Encounter  Procedures   EKG 12-Lead     [name], M.D., M.S. Interventional Cardiologist   Pager # [phone] Phone # [phone] [address] [place], [place] [address]   Thank you for choosing Heartcare at [facility]!!

## 2020-04-04 NOTE — Telephone Encounter (Signed)
Spoke to Ms Adela Glimpse  -at [facility]    informed her  Of Dr Herbie Baltimore intention to possible have the patient to wear an event monitor.  Per Ms Adela Glimpse, if the monitor is charged by a third party - this will have to have  [facility] authorization for  Monitor.  Per Ms Adela Glimpse , Dr Herbie Baltimore can  Clear document in notes that he recommends a event monitoring for the certain amount of time  Correction department  - use a company [employer] ( global diagnostic [employer].  The patient will wear monitor an the documentation of report will  be fax to Dr Herbie Baltimore once information is collected.

## 2020-04-04 NOTE — Patient Instructions (Signed)
Medication Instructions:    CHANGE ROSUVASTATIN  TO 40 MG DAILY  START TAKING IMDUR 30 MG ONE TABLET DAILY '  *If you need a refill on your cardiac medications before your next appointment, please call your pharmacy*   Lab Work: NOT NEEDED   Testing/Procedures:  RECOMMEND  A 7 DAY EVENT MONITOR - PLEASE FAX RESULTS OF MONITOR TO DR HARDING  DX PALPATIONS    Follow-Up: At [facility], you and your health needs are our priority.  As part of our continuing mission to provide you with exceptional heart care, we have created designated Provider Care Teams.  These Care Teams include your primary Cardiologist (physician) and Advanced Practice Providers (APPs -  Physician Assistants and Nurse Practitioners) who all work together to provide you with the care you need, when you need it.    Your next appointment:   7 week(s)  The format for your next appointment:   Either In Person or Virtual  Provider:   [name], MD   Other Instructions

## 2020-04-08 ENCOUNTER — Encounter: Payer: Self-pay | Admitting: Cardiology

## 2020-04-08 NOTE — Assessment & Plan Note (Signed)
PCI to dominant LCx and mid RCA back in 2017 with existing moderate LAD disease.  He is on beta-blocker, ARB and statin along with Plavix.  Plavix essentially maintenance.   Okay to hold Plavix 5 to 7 days for bleeding/bruising or procedures/surgeries.

## 2020-04-08 NOTE — Assessment & Plan Note (Signed)
He had multivessel CAD with an occluded circumflex and significant RCA disease back in the setting of inferior MI in 2017.  Had PCI to both vessels, and is now off by about 4 years out.  Has had intermittent chest discomfort episodes but is pretty much been well controlled amlodipine and Lopressor.  He is also on ARB for afterload reduction.  Currently noticing some atypical sounding chest discomfort spells.  I would like to see if this could potentially just all be related to some type of arrhythmia, so we will check 7-day monitor first.  We will also restart Imdur 30 mg daily prophylactically.  If symptoms of chest discomfort continue, would probably consider Myoview stress test as his symptoms in the past have not been classically straightforward.

## 2020-04-08 NOTE — Assessment & Plan Note (Signed)
This really seems to be the major issue the fast heart rate spells and irregular heartbeats etc.  He does have PVCs on EKG.  Need to confirm that the is not having SVT, VT or frequent bigeminy/trigeminy.  For now continue current dose of beta-blocker.  We will check 7-day monitor that will be ordered by the correctional system.  Results will be sent to me and will follow up.  Pending results, may titrate beta-blocker for

## 2020-04-08 NOTE — Assessment & Plan Note (Signed)
He presented with pretty significant chest discomfort and was in shock.  My concern is that his symptoms were not classic in that setting, but did have more of a profound chest discomfort with nausea and diaphoresis.  Has done relatively well after PCI.  He is having some unusual symptoms at this time and I would have a low threshold to consider stress test evaluation just to be sure.  I am concerned that symptoms and cries for help may not be fully understood in his current situation.

## 2020-04-08 NOTE — Assessment & Plan Note (Signed)
Chest discomfort is somewhat atypical.  Need to make sure is not simply because of palpitations.  Order monitor.  However on the off chance that it could be anginal in nature, I would add Imdur 30 mg daily.  He is already on amlodipine and beta-blocker.  Based on the fact that we are trying to see what is happening from a rhythm standpoint I do not want titrate beta-blocker at this time, but that we the next option.  Low threshold to consider ischemic evaluation with Myoview.

## 2020-04-08 NOTE — Assessment & Plan Note (Signed)
Borderline blood pressure control today.  He is on losartan and HCTZ along with metoprolol and amlodipine.  -> Pending monitor results, may want to titrate beta-blocker up to 75 or 100 mg twice daily

## 2020-04-08 NOTE — Assessment & Plan Note (Addendum)
Lipids look pretty good, we had done a statin holiday in the past.  LDL that was 81 I would like to be a little bit better than that. ->   Plan: Increase rosuvastatin to 40 mg daily  Will need follow-up labs in 3 to 4 months, this can be ordered after follow-up visit

## 2020-04-09 NOTE — Telephone Encounter (Signed)
Spoke to Ms Brian Harvey, -  RN inquired from Ms Brian Harvey the fax number to send the most recent office note from 04/04/20. And requesting Monitor 7 days and sending results will need a virtual visit there after.   Ms Brian Harvey gave RN the fax number   [phone].

## 2020-10-09 ENCOUNTER — Ambulatory Visit: Admitting: Cardiology

## 2020-11-04 NOTE — Progress Notes (Deleted)
Cardiology Clinic Note   Patient Name: Brian Harvey Date of Encounter: 11/04/2020  Primary Care Provider:  Pcp, No Primary Cardiologist:  [name], MD  Patient Profile    Brian Harvey 50 year old male presents to the clinic today for follow-up evaluation of his hypertension and coronary artery disease.  Past Medical History    Past Medical History:  Diagnosis Date  . Alcohol abuse   . CAD S/P percutaneous coronary angioplasty 05/2016   a. 05/2016 Acute inferior STEMI/PCI: LM nl, pLAD 40%, o-p D2 80% (med Rx), small RI- lat branch 99% (med Rx).  Culprit: mLCX 100%-> (2.75x16 Promus Premier DES - 3.0), o-p RCA 30%, mRCA 99% -> (2.5x16 Promus Premier DES - 2.5), EF 55-65%, Mod MR, High LVEDP  . History of pancreatitis (in setting of alcoholism)   . Hypertensive heart disease    a. 05/2016 Echo: EF 55-60%, no rwma, Gr1 DD, mild MR.  . Peripheral artery disease (HCC) 09/2018   Abnormal TBI's despite relatively normal ABIs (R ABI 0.96, L ABI 0.92).  ->  Abdominal iliac-lower extremity CTA: No significant aortoiliac flow.  Noncritical proximal L Renal A stenosis (~50%). RSFA -few distal lesions ~50%. RATA 100% (poor reconsititution), 2 V runoff (RPTA dominant).  LSFA - distal ~50-60% stenosis. LATA 100% in mid calf w/ reconstitution. 2 V runoff (LPTA dominant)  . ST elevation myocardial infarction (STEMI) of inferolateral wall (HCC) 05/2016   Complicated by Cardiogenic Shock --> 100-% Cx & 99% RCA --> 2V PCI  . Tobacco abuse    Past Surgical History:  Procedure Laterality Date  . CARDIAC CATHETERIZATION N/A 05/28/2016   Procedure: Left Heart Cath and Coronary Angiography;  Surgeon: Peter M Swaziland, MD;  Location: [facility] INVASIVE CV LAB;  Service: Cardiovascular:  LM nl, pLAD 40%, o-p D2 80% (med Rx), small RI- lat branch 99% (med Rx).  Culprit: mLCX 100%-> PCI. O-p RCA 30%, mRCA 99% -> PCI. EF 55-65%, Mod MR, High LVEDP  . CARDIAC CATHETERIZATION N/A 05/28/2016   Procedure: Coronary Stent  Intervention;  Surgeon: Peter M Swaziland, MD;  Location: [facility] INVASIVE CV LAB;  Service: Cardiovascular: Culprit: mLCX 100%-> (2.75x16 Promus Premier DES - 3.0); mRCA 99% -> (2.5x16 Promus Premier DES - 2.5)  . TRANSTHORACIC ECHOCARDIOGRAM  05/29/2016   EF 55-60%. Moderate-severe LVH with severe posterior wall hypertrophy. GR 1 DD with high filling pressures. Mild MR    Allergies  Allergies  Allergen Reactions  . Ace Inhibitors Cough    History of Present Illness    [name] is a PMH of essential hypertension hyperlipidemia, EtOH abuse, hyperkalemia, palpitations and coronary artery disease/STEMI status post PCI with 40% proximal LAD, 80% second diagonal, 90% ramus, 30% ostial to proximal RCA, with DES to proximal circumflex, and DES to mid RCA.  LVEF 55-65%, LVEDP moderately elevated.  He was seen via virtual visit 06/16/2019 after being moved from [facility] facility to [facility] facility.  He complained of leg discomfort and his statin was discontinued.  He was seen and evaluated by Dr. Kirke Corin and was found to have two-vessel outflow bilaterally.  He noted that his leg pain did get better after holding a statin.  He was continued on furosemide 3 days a week and the swelling was better.  He was last seen by Dr. Herbie Baltimore 04/04/2020.  During that time he complained of a sensation of his heart stopping intermittently.  He noted that with laying on right side his accelerated heart rate was worse.  He also described a sensation of pulling a pressure in his heart with laying on his left side.  He reported that taking nitroglycerin with his help.  He reported that his increased heart rate would last for 15 to 20 minutes and decrease when he would get up walk around and get a drink.  He had been transitioned from Brilinta to Plavix however, he did not notice any changes with his dyspnea.  His EKG showed sinus rhythm with occasional PVCs rate of 80 bpm.  He denied chest pain, dyspnea, dizziness.   He reported he had not been exercising regularly.  He was started on isosorbide mononitrate 30 mg daily and his rosuvastatin was changed to 40 mg daily.  He presents the clinic today for follow-up evaluation states***  *** denies chest pain, shortness of breath, lower extremity edema, fatigue, palpitations, melena, hematuria, hemoptysis, diaphoresis, weakness, presyncope, syncope, orthopnea, and PND.   Home Medications    Prior to Admission medications   Medication Sig Start Date End Date Taking? Authorizing Provider  acetaminophen (TYLENOL) 650 MG CR tablet Take 1 tablet (650 mg total) 2 (two) times daily as needed by mouth for pain (FOR HEADACHES AND MUSCLE PAIN). 07/29/17   Marykay LexHarding, David W, MD  amLODipine (NORVASC) 10 MG tablet Take 1 tablet (10 mg total) by mouth daily. 01/26/19   Abelino DerrickKilroy, Luke K, PA-C  clopidogrel (PLAVIX) 75 MG tablet Take 1 tablet (75 mg total) by mouth daily. 02/07/20   Abelino DerrickKilroy, Luke K, PA-C  furosemide (LASIX) 20 MG tablet Take 20 mg by mouth every Monday, Wednesday, and Friday.    [provider]  hydrochlorothiazide (HYDRODIURIL) 25 MG tablet Take 1 tablet (25 mg total) by mouth daily. 07/06/18   Marykay LexHarding, David W, MD  isosorbide mononitrate (IMDUR) 30 MG 24 hr tablet Take 1 tablet (30 mg total) by mouth daily. 04/04/20 07/03/20  Marykay LexHarding, David W, MD  losartan (COZAAR) 50 MG tablet Take 50 mg 2 (two) times daily by mouth.    [provider]  metoprolol tartrate (LOPRESSOR) 50 MG tablet Take 1 tablet (50 mg total) by mouth 2 (two) times daily. 07/06/18 04/04/20  Marykay LexHarding, David W, MD  Naphazoline-Pheniramine 0.027-0.315 % SOLN Apply 1 drop to eye daily. ONE DROP IN AFFECTED EYE DAILY AS DIRECTED    [provider]  nitroGLYCERIN (NITROSTAT) 0.4 MG SL tablet Place 1 tablet (0.4 mg total) under the tongue every 5 (five) minutes x 3 doses as needed for chest pain. 05/30/16   Creig HinesBerge, Christopher Ronald, NP  rosuvastatin (CRESTOR) 40 MG tablet Take 1 tablet  (40 mg total) by mouth daily. 04/04/20 07/03/20  Marykay LexHarding, David W, MD  triamcinolone (NASACORT) 55 MCG/ACT AERO nasal inhaler Place 2 sprays into the nose daily.    [provider]    Family History    Family History  Problem Relation Age of Onset  . Diabetes Father    He indicated that the status of his father is unknown.  Social History    Social History   Socioeconomic History  . Marital status: Single    Spouse name: Not on file  . Number of children: Not on file  . Years of education: Not on file  . Highest education level: Not on file  Occupational History  . Not on file  Tobacco Use  . Smoking status: Former Smoker    Packs/day: 1.50    Years: 25.00    Pack years: 37.50    Types: Cigarettes  Quit date: 01/26/2016    Years since quitting: 4.7  . Smokeless tobacco: Never Used  . Tobacco comment: in prison x 4 months, currently not smoking.  Substance and Sexual Activity  . Alcohol use: Yes    Alcohol/week: 14.0 standard drinks    Types: 14 Cans of beer per week    Comment: in prison x 4 months, currently not drinking.  . Drug use: No  . Sexual activity: Not on file  Other Topics Concern  . Not on file  Social History Narrative   Recently transferred to [facility] facility -hopes to be released from prison the end of 2020.   When not in prison -his home of record is in [place].   Social Determinants of Health   Financial Resource Strain: Not on file  Food Insecurity: Not on file  Transportation Needs: Not on file  Physical Activity: Not on file  Stress: Not on file  Social Connections: Not on file  Intimate Partner Violence: Not on file     Review of Systems    General:  No chills, fever, night sweats or weight changes.  Cardiovascular:  No chest pain, dyspnea on exertion, edema, orthopnea, palpitations, paroxysmal nocturnal dyspnea. Dermatological: No rash, lesions/masses Respiratory: No cough, dyspnea Urologic: No hematuria,  dysuria Abdominal:   No nausea, vomiting, diarrhea, bright red blood per rectum, melena, or hematemesis Neurologic:  No visual changes, wkns, changes in mental status. All other systems reviewed and are otherwise negative except as noted above.  Physical Exam    VS:  There were no vitals taken for this visit. , BMI There is no height or weight on file to calculate BMI. GEN: Well nourished, well developed, in no acute distress. HEENT: normal. Neck: Supple, no JVD, carotid bruits, or masses. Cardiac: RRR, no murmurs, rubs, or gallops. No clubbing, cyanosis, edema.  Radials/DP/PT 2+ and equal bilaterally.  Respiratory:  Respirations regular and unlabored, clear to auscultation bilaterally. GI: Soft, nontender, nondistended, BS + x 4. MS: no deformity or atrophy. Skin: warm and dry, no rash. Neuro:  Strength and sensation are intact. Psych: Normal affect.  Accessory Clinical Findings    Recent Labs: No results found for requested labs within last 8760 hours.   Recent Lipid Panel    Component Value Date/Time   CHOL 156 05/29/2016 0241   TRIG 163 (H) 05/29/2016 0241   HDL 28 (L) 05/29/2016 0241   CHOLHDL 5.6 05/29/2016 0241   VLDL 33 05/29/2016 0241   LDLCALC 95 05/29/2016 0241    ECG personally reviewed by me today- *** - No acute changes  Echocardiogram 05/29/2016 Study Conclusions   - Left ventricle: The cavity size was normal. Septal wall thickness  was increased in a pattern of moderate LVH with severe  hypertrophy of the posterior wall. Systolic function was normal.  The estimated ejection fraction was in the range of 55% to 60%.  Wall motion was normal; there were no regional wall motion  abnormalities. Doppler parameters are consistent with abnormal  left ventricular relaxation (grade 1 diastolic dysfunction).  Doppler parameters are consistent with high ventricular filling  pressure.  - Aortic valve: Transvalvular velocity was within the normal range.   There was no stenosis. There was no regurgitation.  - Mitral valve: Transvalvular velocity was within the normal range.  There was no evidence for stenosis. There was mild regurgitation  directed posteriorly.  - Right ventricle: The cavity size was normal. Wall thickness was  normal. Systolic function was normal.  -  Atrial septum: No defect or patent foramen ovale was identified  by color flow Doppler.  - Tricuspid valve: There was no regurgitation.  Cardiac catheterization 05/28/2016  Prox LAD lesion, 40 %stenosed.  Ost 2nd Diag lesion, 80 %stenosed.  Lat Ramus lesion, 99 %stenosed.  Ost RCA to Prox RCA lesion, 30 %stenosed.  The left ventricular systolic function is normal.  LV end diastolic pressure is moderately elevated.  The left ventricular ejection fraction is 55-65% by visual estimate.  There is moderate (3+) mitral regurgitation.  A STENT PROMUS PREM MR 2.75X16 drug eluting stent was successfully placed.  Prox Cx lesion, 100 %stenosed.  Post intervention, there is a 0% residual stenosis.  A STENT PROMUS PREM MR 2.25X16 drug eluting stent was successfully placed.  Mid RCA lesion, 99 %stenosed.  Post intervention, there is a 0% residual stenosis.   1. Severe 3 vessel obstructive CAD    - 80% stenosis of the second diagonal    - 99% stenosis of a small sub-branch of the ramus intermediate    - 100% occlusion of the mid LCx. This is the culprit lesion    - 99% mid RCA 2. Normal LV function with moderately elevated LVEDP 3. Moderate MR 4. Successful stenting of the mid LCx with DES 5. Successful stenting of the mid RCA with DES  Plan: transfer to ICU. Continue IV levophed for BP support. Check Echo today to assess MR - I suspect papillary muscle ischemia may have contributed to presentation with shock. DAPT for one year.  Diagnostic Dominance: Co-dominant    Intervention       Assessment & Plan   1.  Coronary artery disease status  post PCI/chest pain -no chest pain today.  Underwent cardiac catheterization 2017 and received PCI with DES x2 to his circumflex and mid RCA.  Patient last seen 04/04/2020 Imdur and rosuvastatin increased at that time with plan for nuclear stress test if symptoms continued. Continue rosuvastatin, Imdur, Plavix, amlodipine, hydralazine, losartan, metoprolol, nitroglycerin Heart healthy low-sodium diet-salty 6 given Increase physical activity as tolerated  Essential hypertension-BP today***.  Fairly well controlled at home. Continue amlodipine, furosemide, HCTZ, losartan, Imdur, metoprolol Heart healthy low-sodium diet-salty 6 given Increase physical activity as tolerated  Hyperlipidemia-LDL 95 on 05/29/2016 Continue rosuvastatin Heart healthy low-sodium high-fiber diet Increase physical activity as tolerated Repeat fasting lipids and LFTs  Palpitations-recent episodes of increased heart rate or skipped beats.  Previously described sensation heart stopping, pulling and tightness.  Cardiac event monitor ordered but was not completed. Continue Imdur, metoprolol Avoid triggers caffeine, chocolate, EtOH, dehydration etc. Increase physical activity as tolerated   Disposition: Follow-up with Dr. Herbie Baltimore in 6 months.  Thomasene Ripple. Anasofia Micallef NP-C    11/04/2020, 7:53 AM [facility] [address] Office [phone] Fax [phone]  Notice: This dictation was prepared with Dragon dictation along with smaller phrase technology. Any transcriptional errors that result from this process are unintentional and may not be corrected upon review.  I spent***minutes examining this patient, reviewing medications, and using patient centered shared decision making involving her cardiac care.  Prior to her visit I spent greater than 20 minutes reviewing her past medical history,  medications, and prior cardiac tests.

## 2020-11-05 ENCOUNTER — Ambulatory Visit: Admitting: General Practice
